# Patient Record
Sex: Male | Born: 1970
Health system: Southern US, Community
[De-identification: ages and names within clinical notes are randomized; demographics above are authoritative.]

## PROBLEM LIST (undated history)

## (undated) DIAGNOSIS — Z9889 Other specified postprocedural states: Secondary | ICD-10-CM

## (undated) DIAGNOSIS — R0989 Other specified symptoms and signs involving the circulatory and respiratory systems: Secondary | ICD-10-CM

## (undated) DIAGNOSIS — I10 Essential (primary) hypertension: Secondary | ICD-10-CM

## (undated) DIAGNOSIS — I639 Cerebral infarction, unspecified: Secondary | ICD-10-CM

## (undated) HISTORY — DX: Other specified symptoms and signs involving the circulatory and respiratory systems: R09.89

## (undated) HISTORY — DX: Essential (primary) hypertension: I10

## (undated) HISTORY — DX: Other specified postprocedural states: Z98.890

## (undated) HISTORY — PX: REPAIR OF ACUTE ASCENDING THORACIC AORTIC DISSECTION: SHX6323

---

## 2008-09-14 ENCOUNTER — Encounter: Admission: RE | Admit: 2008-09-14 | Discharge: 2008-09-14 | Payer: Self-pay | Admitting: Internal Medicine

## 2009-11-19 DIAGNOSIS — I639 Cerebral infarction, unspecified: Secondary | ICD-10-CM

## 2009-11-19 HISTORY — DX: Cerebral infarction, unspecified: I63.9

## 2010-06-17 ENCOUNTER — Inpatient Hospital Stay (HOSPITAL_COMMUNITY): Admission: EM | Admit: 2010-06-17 | Discharge: 2010-06-18 | Payer: Self-pay | Admitting: Emergency Medicine

## 2011-02-03 LAB — POCT I-STAT, CHEM 8
BUN: 7 mg/dL (ref 6–23)
Chloride: 106 mEq/L (ref 96–112)
Creatinine, Ser: 0.9 mg/dL (ref 0.4–1.5)
Glucose, Bld: 115 mg/dL — ABNORMAL HIGH (ref 70–99)
Potassium: 3.9 mEq/L (ref 3.5–5.1)
Sodium: 141 mEq/L (ref 135–145)
TCO2: 25 mmol/L (ref 0–100)

## 2011-02-03 LAB — COMPREHENSIVE METABOLIC PANEL
ALT: 21 U/L (ref 0–53)
ALT: 21 U/L (ref 0–53)
ALT: 22 U/L (ref 0–53)
AST: 25 U/L (ref 0–37)
AST: 27 U/L (ref 0–37)
Albumin: 3.9 g/dL (ref 3.5–5.2)
Alkaline Phosphatase: 87 U/L (ref 39–117)
Alkaline Phosphatase: 91 U/L (ref 39–117)
BUN: 7 mg/dL (ref 6–23)
CO2: 25 mEq/L (ref 19–32)
Calcium: 9 mg/dL (ref 8.4–10.5)
Calcium: 9 mg/dL (ref 8.4–10.5)
Calcium: 9.3 mg/dL (ref 8.4–10.5)
Chloride: 106 mEq/L (ref 96–112)
Creatinine, Ser: 0.93 mg/dL (ref 0.4–1.5)
Creatinine, Ser: 0.93 mg/dL (ref 0.4–1.5)
GFR calc Af Amer: >60 mL/min (ref >60)
GFR calc non Af Amer: >60 mL/min (ref >60)
GFR calc non Af Amer: >60 mL/min (ref >60)
Glucose, Bld: 110 mg/dL — ABNORMAL HIGH (ref 70–99)
Glucose, Bld: 125 mg/dL — ABNORMAL HIGH (ref 70–99)
Sodium: 139 mEq/L (ref 135–145)
Sodium: 140 mEq/L (ref 135–145)
Total Protein: 7.5 g/dL (ref 6.0–8.3)

## 2011-02-03 LAB — CBC
HCT: 43.5 % (ref 39.0–52.0)
HCT: 43.6 % (ref 39.0–52.0)
Hemoglobin: 14.5 g/dL (ref 13.0–17.0)
MCH: 29.8 pg (ref 26.0–34.0)
MCHC: 33 g/dL (ref 30.0–36.0)
MCHC: 33.6 g/dL (ref 30.0–36.0)
MCV: 89.5 fL (ref 78.0–100.0)
MCV: 89.8 fL (ref 78.0–100.0)
Platelets: 212 10*3/uL (ref 150–400)
RDW: 13.2 % (ref 11.5–15.5)
RDW: 13.6 % (ref 11.5–15.5)
WBC: 8.9 10*3/uL (ref 4.0–10.5)

## 2011-02-03 LAB — URINALYSIS, ROUTINE W REFLEX MICROSCOPIC
Bilirubin Urine: NEGATIVE
Glucose, UA: NEGATIVE mg/dL
Protein, ur: NEGATIVE mg/dL
Urobilinogen, UA: 4 mg/dL — ABNORMAL HIGH (ref 0.0–1.0)

## 2011-02-03 LAB — LIPID PANEL
Cholesterol: 166 mg/dL (ref 0–200)
HDL: 39 mg/dL — ABNORMAL LOW (ref >39)
LDL Cholesterol: 117 mg/dL — ABNORMAL HIGH (ref 0–99)
Total CHOL/HDL Ratio: 4.3 RATIO
Triglycerides: 49 mg/dL (ref <150)

## 2011-02-03 LAB — CK TOTAL AND CKMB (NOT AT ARMC)
CK, MB: 1.2 ng/mL (ref 0.3–4.0)
Total CK: 107 U/L (ref 7–232)

## 2011-02-03 LAB — HEMOGLOBIN A1C: Mean Plasma Glucose: 117 mg/dL — ABNORMAL HIGH (ref <117)

## 2011-02-03 LAB — PROTIME-INR: INR: 1.05 (ref 0.00–1.49)

## 2011-02-03 LAB — DIFFERENTIAL
Basophils Relative: 0 % (ref 0–1)
Eosinophils Absolute: 0 10*3/uL (ref 0.0–0.7)
Lymphocytes Relative: 20 % (ref 12–46)
Lymphs Abs: 1.1 10*3/uL (ref 0.7–4.0)
Monocytes Relative: 11 % (ref 3–12)
Neutro Abs: 5.8 10*3/uL (ref 1.7–7.7)
Neutrophils Relative %: 81 % — ABNORMAL HIGH (ref 43–77)

## 2011-02-03 LAB — RAPID URINE DRUG SCREEN, HOSP PERFORMED
Barbiturates: NOT DETECTED
Opiates: NOT DETECTED
Tetrahydrocannabinol: NOT DETECTED

## 2011-02-03 LAB — APTT: aPTT: 27 seconds (ref 24–37)

## 2011-02-03 LAB — URINE MICROSCOPIC-ADD ON

## 2011-02-03 LAB — SEDIMENTATION RATE: Sed Rate: 15 mm/hr (ref 0–16)

## 2011-02-03 LAB — CARDIAC PANEL(CRET KIN+CKTOT+MB+TROPI)
CK, MB: 1.1 ng/mL (ref 0.3–4.0)
Total CK: 99 U/L (ref 7–232)

## 2015-05-30 ENCOUNTER — Ambulatory Visit (HOSPITAL_COMMUNITY)
Admission: RE | Admit: 2015-05-30 | Discharge: 2015-05-30 | Disposition: A | Discharge status: Home / Self Care | Payer: BLUE CROSS/BLUE SHIELD | Source: Ambulatory Visit | Attending: Cardiology | Admitting: Cardiology

## 2015-05-30 ENCOUNTER — Other Ambulatory Visit (HOSPITAL_COMMUNITY): Payer: Self-pay | Admitting: Physician Assistant

## 2015-05-30 DIAGNOSIS — M7989 Other specified soft tissue disorders: Secondary | ICD-10-CM | POA: Insufficient documentation

## 2015-05-30 DIAGNOSIS — I82402 Acute embolism and thrombosis of unspecified deep veins of left lower extremity: Secondary | ICD-10-CM | POA: Diagnosis not present

## 2015-05-30 NOTE — Progress Notes (Signed)
Left lower extremity venous duplex completed.  Left:  No evidence of DVT, superficial thrombosis, or Baker's cyst.  Right:  Negative for DVT in the common femoral vein.  

## 2017-12-23 DIAGNOSIS — R030 Elevated blood-pressure reading, without diagnosis of hypertension: Secondary | ICD-10-CM | POA: Diagnosis not present

## 2018-01-17 DEATH — deceased

## 2018-03-26 DIAGNOSIS — Z23 Encounter for immunization: Secondary | ICD-10-CM | POA: Diagnosis not present

## 2018-03-26 DIAGNOSIS — Z0000 Encounter for general adult medical examination without abnormal findings: Secondary | ICD-10-CM | POA: Diagnosis not present

## 2018-03-26 DIAGNOSIS — R030 Elevated blood-pressure reading, without diagnosis of hypertension: Secondary | ICD-10-CM | POA: Diagnosis not present

## 2018-03-26 DIAGNOSIS — Z6833 Body mass index (BMI) 33.0-33.9, adult: Secondary | ICD-10-CM | POA: Diagnosis not present

## 2018-03-26 DIAGNOSIS — Z13220 Encounter for screening for lipoid disorders: Secondary | ICD-10-CM | POA: Diagnosis not present

## 2018-09-02 ENCOUNTER — Emergency Department (HOSPITAL_COMMUNITY): Payer: BLUE CROSS/BLUE SHIELD

## 2018-09-02 ENCOUNTER — Emergency Department (HOSPITAL_COMMUNITY)
Admission: EM | Admit: 2018-09-02 | Discharge: 2018-09-02 | Disposition: A | Discharge status: Other Acute Inpatient Hospital | Payer: BLUE CROSS/BLUE SHIELD | Attending: Emergency Medicine | Admitting: Emergency Medicine

## 2018-09-02 ENCOUNTER — Other Ambulatory Visit: Payer: Self-pay

## 2018-09-02 ENCOUNTER — Encounter (HOSPITAL_COMMUNITY): Payer: Self-pay | Admitting: Emergency Medicine

## 2018-09-02 DIAGNOSIS — R61 Generalized hyperhidrosis: Secondary | ICD-10-CM | POA: Diagnosis not present

## 2018-09-02 DIAGNOSIS — R1310 Dysphagia, unspecified: Secondary | ICD-10-CM | POA: Diagnosis not present

## 2018-09-02 DIAGNOSIS — D696 Thrombocytopenia, unspecified: Secondary | ICD-10-CM | POA: Diagnosis not present

## 2018-09-02 DIAGNOSIS — Z9981 Dependence on supplemental oxygen: Secondary | ICD-10-CM | POA: Diagnosis not present

## 2018-09-02 DIAGNOSIS — Z48812 Encounter for surgical aftercare following surgery on the circulatory system: Secondary | ICD-10-CM | POA: Diagnosis not present

## 2018-09-02 DIAGNOSIS — I714 Abdominal aortic aneurysm, without rupture: Secondary | ICD-10-CM | POA: Diagnosis not present

## 2018-09-02 DIAGNOSIS — Y69 Unspecified misadventure during surgical and medical care: Secondary | ICD-10-CM | POA: Diagnosis not present

## 2018-09-02 DIAGNOSIS — J9811 Atelectasis: Secondary | ICD-10-CM | POA: Diagnosis not present

## 2018-09-02 DIAGNOSIS — M545 Low back pain: Secondary | ICD-10-CM | POA: Diagnosis not present

## 2018-09-02 DIAGNOSIS — R030 Elevated blood-pressure reading, without diagnosis of hypertension: Secondary | ICD-10-CM | POA: Diagnosis not present

## 2018-09-02 DIAGNOSIS — I7101 Dissection of thoracic aorta: Secondary | ICD-10-CM | POA: Diagnosis not present

## 2018-09-02 DIAGNOSIS — Z79899 Other long term (current) drug therapy: Secondary | ICD-10-CM | POA: Diagnosis not present

## 2018-09-02 DIAGNOSIS — R918 Other nonspecific abnormal finding of lung field: Secondary | ICD-10-CM | POA: Diagnosis not present

## 2018-09-02 DIAGNOSIS — I161 Hypertensive emergency: Secondary | ICD-10-CM | POA: Diagnosis not present

## 2018-09-02 DIAGNOSIS — J9691 Respiratory failure, unspecified with hypoxia: Secondary | ICD-10-CM | POA: Diagnosis not present

## 2018-09-02 DIAGNOSIS — J90 Pleural effusion, not elsewhere classified: Secondary | ICD-10-CM | POA: Diagnosis not present

## 2018-09-02 DIAGNOSIS — J95821 Acute postprocedural respiratory failure: Secondary | ICD-10-CM | POA: Diagnosis not present

## 2018-09-02 DIAGNOSIS — I712 Thoracic aortic aneurysm, without rupture: Secondary | ICD-10-CM | POA: Diagnosis not present

## 2018-09-02 DIAGNOSIS — R0602 Shortness of breath: Secondary | ICD-10-CM | POA: Diagnosis not present

## 2018-09-02 DIAGNOSIS — I351 Nonrheumatic aortic (valve) insufficiency: Secondary | ICD-10-CM | POA: Diagnosis not present

## 2018-09-02 DIAGNOSIS — Z7982 Long term (current) use of aspirin: Secondary | ICD-10-CM | POA: Diagnosis not present

## 2018-09-02 DIAGNOSIS — I517 Cardiomegaly: Secondary | ICD-10-CM | POA: Diagnosis not present

## 2018-09-02 DIAGNOSIS — I7103 Dissection of thoracoabdominal aorta: Secondary | ICD-10-CM

## 2018-09-02 DIAGNOSIS — R9431 Abnormal electrocardiogram [ECG] [EKG]: Secondary | ICD-10-CM | POA: Diagnosis not present

## 2018-09-02 DIAGNOSIS — I10 Essential (primary) hypertension: Secondary | ICD-10-CM | POA: Diagnosis not present

## 2018-09-02 DIAGNOSIS — T8130XA Disruption of wound, unspecified, initial encounter: Secondary | ICD-10-CM | POA: Diagnosis not present

## 2018-09-02 DIAGNOSIS — E876 Hypokalemia: Secondary | ICD-10-CM | POA: Diagnosis not present

## 2018-09-02 HISTORY — DX: Cerebral infarction, unspecified: I63.9

## 2018-09-02 LAB — CBC WITH DIFFERENTIAL/PLATELET
Abs Immature Granulocytes: 0.14 10*3/uL — ABNORMAL HIGH (ref 0.00–0.07)
BASOS PCT: 0 %
Basophils Absolute: 0 10*3/uL (ref 0.0–0.1)
EOS ABS: 0 10*3/uL (ref 0.0–0.5)
Eosinophils Relative: 0 %
HCT: 46.8 % (ref 39.0–52.0)
Hemoglobin: 14.4 g/dL (ref 13.0–17.0)
Immature Granulocytes: 1 %
Lymphocytes Relative: 6 %
Lymphs Abs: 1.3 10*3/uL (ref 0.7–4.0)
MCH: 27.9 pg (ref 26.0–34.0)
MCHC: 30.8 g/dL (ref 30.0–36.0)
MCV: 90.7 fL (ref 80.0–100.0)
MONOS PCT: 8 %
Monocytes Absolute: 1.9 10*3/uL — ABNORMAL HIGH (ref 0.1–1.0)
NEUTROS PCT: 85 %
Neutro Abs: 19.7 10*3/uL — ABNORMAL HIGH (ref 1.7–7.7)
PLATELETS: 275 10*3/uL (ref 150–400)
RBC: 5.16 MIL/uL (ref 4.22–5.81)
RDW: 13.2 % (ref 11.5–15.5)
WBC: 23 10*3/uL — AB (ref 4.0–10.5)
nRBC: 0 % (ref 0.0–0.2)

## 2018-09-02 LAB — COMPREHENSIVE METABOLIC PANEL
ALT: 26 U/L (ref 0–44)
ANION GAP: 13 (ref 5–15)
AST: 33 U/L (ref 15–41)
Albumin: 3.8 g/dL (ref 3.5–5.0)
Alkaline Phosphatase: 75 U/L (ref 38–126)
BUN: 18 mg/dL (ref 6–20)
CHLORIDE: 103 mmol/L (ref 98–111)
CO2: 22 mmol/L (ref 22–32)
Calcium: 9.8 mg/dL (ref 8.9–10.3)
Creatinine, Ser: 1.36 mg/dL — ABNORMAL HIGH (ref 0.61–1.24)
GFR, EST NON AFRICAN AMERICAN: 60 mL/min — AB (ref >60)
Glucose, Bld: 150 mg/dL — ABNORMAL HIGH (ref 70–99)
Potassium: 5 mmol/L (ref 3.5–5.1)
Sodium: 138 mmol/L (ref 135–145)
Total Bilirubin: 0.8 mg/dL (ref 0.3–1.2)
Total Protein: 7.3 g/dL (ref 6.5–8.1)

## 2018-09-02 LAB — I-STAT TROPONIN, ED: TROPONIN I, POC: 0.03 ng/mL (ref 0.00–0.08)

## 2018-09-02 MED ORDER — IOPAMIDOL (ISOVUE-370) INJECTION 76%
100.0000 mL | Freq: Once | INTRAVENOUS | Status: AC | PRN
  Administered 2018-09-02: 100 mL via INTRAVENOUS

## 2018-09-02 MED ORDER — IOPAMIDOL (ISOVUE-370) INJECTION 76%
INTRAVENOUS | Status: AC
  Filled 2018-09-02: qty 100

## 2018-09-02 MED ORDER — IOHEXOL 300 MG/ML  SOLN: 100.0000 mL | Freq: Once | INTRAMUSCULAR | Status: DC | PRN

## 2018-09-02 MED ORDER — HYDROMORPHONE HCL 1 MG/ML IJ SOLN
0.5000 mg | Freq: Once | INTRAMUSCULAR | Status: AC
  Administered 2018-09-02: 0.5 mg via INTRAVENOUS
  Filled 2018-09-02: qty 1

## 2018-09-02 MED ORDER — ESMOLOL HCL-SODIUM CHLORIDE 2000 MG/100ML IV SOLN
25.0000 ug/kg/min | INTRAVENOUS | Status: DC
  Administered 2018-09-02: 25 ug/kg/min via INTRAVENOUS
  Filled 2018-09-02: qty 100

## 2018-09-02 NOTE — ED Notes (Signed)
Patient transported to CT 

## 2018-09-02 NOTE — ED Provider Notes (Signed)
I saw and evaluated the patient, reviewed the resident's note and I agree with the findings and plan with the following exceptions.   Seen earlier today was hypertensive, diaphoretic with left-sided back pain.  Otherwise appears well.  Concern for possible dissection.  My review of the CT scan it does appear the patient has aortic dissection.   Discussed with Dr. Barry Dienes around 1950 face to face. He is taking a more critically ill aortic dissection patient to OR at the present time and expects a prolonged surgery and thus is not available to perform this one. No other CT surgeons on call, will attempt transfer.   Discussed with Dr. Ty Hilts at [facility] and accepts the patient to the emergency room while the OR team is being called in.  Esmolol started with a goal of heart rate less than 60 and blood pressure 100-120.  Pain is nonexistent this point after the second dose of Dilaudid.   [facility] to transfer to [facility].   EMERGENCY DEPARTMENT  US GUIDANCE EXAM Emergency Ultrasound:  US Guidance for Needle Guidance  INDICATIONS: Difficult vascular access Linear probe used in real-time to visualize location of needle entry through skin.   PERFORMED BY: Myself IMAGES ARCHIVED?: No LIMITATIONS: Pain VIEWS USED: Transverse INTERPRETATION: Needle visualized within vein, Left arm and Needle gauge 18  CRITICAL CARE Performed by: Marily Memos Total critical care time: 35 minutes Critical care time was exclusive of separately billable procedures and treating other patients. Critical care was necessary to treat or prevent imminent or life-threatening deterioration. Critical care was time spent personally by me on the following activities: development of treatment plan with patient and/or surrogate as well as nursing, discussions with consultants, evaluation of patient's response to treatment, examination of patient, obtaining history from patient or surrogate, ordering and performing treatments  and interventions, ordering and review of laboratory studies, ordering and review of radiographic studies, pulse oximetry and re-evaluation of patient's condition.    EKG Interpretation  Date/Time:  Tuesday September 02 2018 16:11:56 EDT Ventricular Rate:  86 PR Interval:  158 QRS Duration: 88 QT Interval:  368 QTC Calculation: 440 R Axis:   -7 Text Interpretation:  Normal sinus rhythm Minimal voltage criteria for LVH, may be normal variant Possible Anterior infarct , age undetermined Abnormal ECG No significant change since last tracing Confirmed by Marily Memos [phone]) on 09/02/2018 6:50:55 PM         Talal Fritchman, Barbara Cower, MD 09/02/18 2224

## 2018-09-02 NOTE — ED Notes (Signed)
Pt consented verbally to this RN at [facility] for transport to [facility].

## 2018-09-02 NOTE — ED Triage Notes (Signed)
Patient sent to ED by PCP via POV for further evaluation of BP readings (170/40) and patient presentation - PCP at [facility] reported that patient was sweating profusely in the office and he was pale and diaphoretic. He went to the doctor c/o severe lower back pain onset this morning upon waking - nothing makes it worse or better, it does not radiate, nor is it associated with numbness or tingling. He reports the pain has since improved, but is still there. Denies injury, thinks he may have just slept on it wrong. No significant medical history.

## 2018-09-02 NOTE — ED Notes (Signed)
Report given to [facility] EMT with [facility]

## 2018-09-02 NOTE — ED Notes (Signed)
0.5 mg of Dilaudid wasted with Rhunette Croft, Charity fundraiser,

## 2018-09-02 NOTE — ED Notes (Signed)
Pt back from CT

## 2018-09-02 NOTE — ED Provider Notes (Signed)
[facility] EMERGENCY DEPARTMENT Provider Note   CSN: [id number] Arrival date & time: 09/02/18  1606     History   Chief Complaint Chief Complaint  Patient presents with  . Palpitations  . Back Pain    HPI Alexis Terry is a 47 y.o. male with no diagnosed PMH who presents for evaluation of back pain. He woke up this morning with left lower back pain, followed by diaphoresis and hearing his heart pounding in his ears. His wife scheduled a same day appointment with his PCP and he was noted to be pale, diaphoretic, and hypertensive at his PCP's office. He was sent to the ED for further evaluation. Since being in the ED, he has experienced intermittent diaphoresis and persistent low back pain rated at a 5-7 out of 10 and described as achy. He has taken aspirin and Aleve for the back pain without much relief.   Does not take any blood pressure medication, but has been told her blood pressure was borderline high.   Denies chest pain, sob, nausea, syncope, indigestion.   Never smoker, does not drink alcohol, denies illicit drug use.   HPI  Past Medical History:  Diagnosis Date  . Stroke [facility]) 2011   patient's wife reports "small stroke involving his eye" about 8 years ago, no deficits noted    There are no active problems to display for this patient.   History reviewed. No pertinent surgical history.      Home Medications    Prior to Admission medications   Medication Sig Start Date End Date Taking? Authorizing Provider  acetaminophen (TYLENOL) 500 MG tablet Take 500 mg by mouth every 6 (six) hours as needed for mild pain.    Yes [provider]  aspirin EC 81 MG tablet Take 81 mg by mouth daily.   Yes [provider]  ibuprofen (ADVIL,MOTRIN) 200 MG tablet Take 4,000 mg by mouth every 6 (six) hours as needed for moderate pain.    Yes [provider]    Family History No family history on file.  Social History Social  History   Tobacco Use  . Smoking status: Never Smoker  . Smokeless tobacco: Never Used  Substance Use Topics  . Alcohol use: Not Currently  . Drug use: Never     Allergies   Patient has no known allergies.   Review of Systems Review of Systems See HPI  Physical Exam Updated Vital Signs BP (!) 158/59   Pulse 87   Temp 98.2 F (36.8 C)   Resp 20   Ht 6\' 1"  (1.854 m)   Wt 103.9 kg   SpO2 96%   BMI 30.21 kg/m   Physical Exam Vitals:   09/02/18 1945 09/02/18 2015 09/02/18 2020 09/02/18 2034  BP: (!) 151/53 (!) 160/51 (!) 152/59 (!) 158/59  Pulse: 93 79 89 87  Resp: (!) 26 (!) 24 (!) 22 20  Temp:    98.2 F (36.8 C)  TempSrc:      SpO2: 96% 93% 95% 96%  Weight:      Height:       General: Vital signs reviewed. Hypertensive. Patient is well-developed and well-nourished, in no acute distress and cooperative with exam.  Head: Normocephalic and atraumatic. Neck: Supple, trachea midline, normal ROM, no JVD, masses, thyromegaly, or carotid bruit present.  Cardiovascular: RRR, S1 normal, S2 normal, no murmurs, gallops, or rubs. Radial pulses strong and symmetric. Systolic BP has 14 point difference between upper extremities.  Pulmonary/Chest: Clear to auscultation bilaterally, no wheezes, rales, or rhonchi. Abdominal: Soft, non-tender, non-distended, BS +, no masses, organomegaly, or guarding present.  Musculoskeletal: Endorses low back pain but he has no tenderness to palpation of the midline spine or paraspinal regions bilaterally. No everything redness, swelling, or warmth.  Extremities: No lower extremity edema bilaterally,  pulses symmetric and intact bilaterally. No cyanosis or clubbing. Neurological: A&O x3, lower extremity strength is normal and symmetric bilaterally, cranial nerve II-XII are grossly intact, no focal motor deficit, sensory intact to light touch bilaterally.  Skin: Warm, dry and intact. No rashes or erythema. Psychiatric: anxious mood and affect.  speech and behavior is normal. Cognition and memory are normal.    ED Treatments / Results  Labs (all labs ordered are listed, but only abnormal results are displayed) Labs Reviewed  CBC WITH DIFFERENTIAL/PLATELET - Abnormal; Notable for the following components:      Result Value   WBC 23.0 (*)    Neutro Abs 19.7 (*)    Monocytes Absolute 1.9 (*)    Abs Immature Granulocytes 0.14 (*)    All other components within normal limits  COMPREHENSIVE METABOLIC PANEL - Abnormal; Notable for the following components:   Glucose, Bld 150 (*)    Creatinine, Ser 1.36 (*)    GFR calc non Af Amer 60 (*)    All other components within normal limits  URINALYSIS, ROUTINE W REFLEX MICROSCOPIC  I-STAT TROPONIN, ED    EKG EKG Interpretation  Date/Time:  Tuesday September 02 2018 16:11:56 EDT Ventricular Rate:  86 PR Interval:  158 QRS Duration: 88 QT Interval:  368 QTC Calculation: 440 R Axis:   -7 Text Interpretation:  Normal sinus rhythm Minimal voltage criteria for LVH, may be normal variant Possible Anterior infarct , age undetermined Abnormal ECG No significant change since last tracing Confirmed by Marily Memos [phone]) on 09/02/2018 6:50:55 PM   Radiology Dg Chest 2 View  Result Date: 09/02/2018 CLINICAL DATA:  Shortness of breath and dizziness. EXAM: CHEST - 2 VIEW COMPARISON:  Chest x-ray dated June 17, 2010. FINDINGS: The heart is at the upper limits of normal in size. Normal pulmonary vascularity. No focal consolidation, pleural effusion, or pneumothorax. No acute osseous abnormality. IMPRESSION: No active cardiopulmonary disease. Electronically Signed   By: Obie Dredge M.D.   On: 09/02/2018 17:48   Ct Angio Chest/abd/pel For Dissection W And/or W/wo  Result Date: 09/02/2018 CLINICAL DATA:  Back pain with diaphoresis starting this morning. EXAM: CT ANGIOGRAPHY CHEST, ABDOMEN AND PELVIS TECHNIQUE: Multidetector CT imaging through the chest, abdomen and pelvis was performed using  the standard protocol during bolus administration of intravenous contrast. Multiplanar reconstructed images and MIPs were obtained and reviewed to evaluate the vascular anatomy. CONTRAST:  ISOVUE-370 IOPAMIDOL (ISOVUE-370) INJECTION 76% COMPARISON:  , Same day CXR FINDINGS: CTA CHEST FINDINGS Cardiovascular: Aneurysmal dilatation of the aortic root to 5.1 cm. Acute Stanford type A thoracic aortic dissection is noted with extension of dissection into the right brachiocephalic artery and left subclavian artery. The dissection flap spirals obliquely into the abdominal aorta. No acute pulmonary embolus. Mediastinum/Nodes: No enlarged mediastinal, hilar, or axillary lymph nodes. Thyroid gland, trachea, and esophagus demonstrate no significant findings. Lungs/Pleura: Bibasilar dependent atelectasis. No pulmonary consolidation, effusion or pneumothorax. Musculoskeletal: No chest wall abnormality. No acute or significant osseous findings. Review of the MIP images confirms the above findings. CTA ABDOMEN AND PELVIS FINDINGS VASCULAR Aorta: The thoracic aortic dissection extends into the abdominal aorta. No aneurysm identified of  the abdominal aorta. Celiac: Patent and fed from the anterior lumen of the dissection. No occlusion or extension of dissection. No aneurysmal dilatation. SMA: Patent infant bed anterior lumen of the dissection. No occlusion or extension dissection. No aneurysmal dilatation. Renals: Fifth by the posterior lumen on right and likewise on the left. There appears to be faint linear dissection flap extending into the left renal artery which may be contributing to the renal infarcts involving the upper, medial and lower pole. IMA: Opacified and appears to project off the posterior lumen that is swirling obliquely from posterior to anterior at this level. Inflow: Extension of dissection into both common iliac arteries without aneurysmal dilatation. Extension of dissection into both external iliac  arteries and right internal iliac artery with occlusion of the distal left internal iliac artery. The left internal iliac artery appears patent. Veins: Negative Review of the MIP images confirms the above findings. NON-VASCULAR Hepatobiliary: No focal liver abnormality is seen. No gallstones, gallbladder wall thickening, or biliary dilatation. Pancreas: Unremarkable. No pancreatic ductal dilatation or surrounding inflammatory changes. Spleen: Normal in size without focal abnormality. Adrenals/Urinary Tract: Normal bilateral adrenal glands. Renal infarcts on left as described. Cyst in the lower pole the right kidney measuring 14 mm in diameter. No hydroureteronephrosis. The urinary bladder is unremarkable. Stomach/Bowel: No evidence of pneumatosis, mural thickening or edema. No obstruction. Lymphatic: Negative Reproductive: Prostate is unremarkable. Other: No abdominal wall hernia or abnormality. No abdominopelvic ascites. Musculoskeletal: No acute or significant osseous findings. Review of the MIP images confirms the above findings. IMPRESSION: Acute Stanford type A thoracic aortic dissection starting from the aortic root which is aneurysmal in appearance up to 5.1 cm in caliber. Spiraling of the dissection flap is noted with involvement of dissection into the right brachiocephalic and left subclavian arteries. Extension of dissection into the nonaneurysmal abdominal aorta, both common iliac and external iliac arteries as well as extension into the right internal iliac artery with occluded or near occluded appearance of the distal right common iliac artery and extension of the dissection into the left renal artery. 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 Critical Value/emergent results were called by telephone at the time of interpretation on 09/02/2018 at 8:13 pm to Dr. Maryelizabeth Kaufmann, who verbally acknowledged these results. Electronically Signed   By: Tollie Eth M.D.   On: 09/02/2018  20:14    Procedures Procedures (including critical care time)  Medications Ordered in ED Medications  iohexol (OMNIPAQUE) 300 MG/ML solution 100 mL (has no administration in time range)  iopamidol (ISOVUE-370) 76 % injection (has no administration in time range)  esmolol (BREVIBLOC) 2000 mg / 100 mL (20 mg/mL) infusion (25 mcg/kg/min  103.9 kg Intravenous Transfusing/Transfer 09/02/18 2051)  HYDROmorphone (DILAUDID) injection 0.5 mg (0.5 mg Intravenous Given 09/02/18 1919)  iopamidol (ISOVUE-370) 76 % injection 100 mL (100 mLs Intravenous Contrast Given 09/02/18 1930)  HYDROmorphone (DILAUDID) injection 0.5 mg (0.5 mg Intravenous Given 09/02/18 2012)     Initial Impression / Assessment and Plan / ED Course  I have reviewed the triage vital signs and the nursing notes.  Pertinent labs & imaging results that were available during my care of the patient were reviewed by me and considered in my medical decision making (see chart for details).  47yo male with no significant PMHx presents with acute onset low back pain when he woke up this morning with associated sob, diaphoresis, and "hearing his heart pound in his ears." He is hypertensive with widened pulse pressure, but  comfortable appearing. Currently he is only experiencing bilateral low back pain. Concern for aortic dissection given hypertension, diaphoresis, and back pain. Doubt MI since no chest pain, troponin is normal and no ischemic changes on EKG. Could be symptomatic hypertension but would like to rule out aortic dissection.   Given 0.5mg  IV dilaudid for back pain.   CT reveals type A aortic dissection with dilated aortic root and dissection extending into the pelvis with left kidney infarction and right internal iliac artery blockage.   Patient remains hemodynamically stable and hypertensive. Will start IV esmolol and titrate to HR < 60 and SBP 100-120. CT surgery is involved with another case so patient will need to be  transferred elsewhere for surgery.   Will transfer to [facility] ED. Dr. Ty Hilts is accepting his care.   Final Clinical Impressions(s) / ED Diagnoses   Final diagnoses:  Dissection of thoracoabdominal aorta [facility])    ED Discharge Orders    None       Ali Lowe, MD 09/02/18 2135    Marily Memos, MD 09/02/18 2219

## 2018-09-02 NOTE — ED Notes (Signed)
This RN called CT to request CD of scan to be sent with pt to [facility], was told by CT that they would not be providing the CD before [facility] arrives due to a code stroke. MD mesner aware.

## 2018-09-02 NOTE — ED Provider Notes (Signed)
Patient placed in Quick Look pathway, seen and evaluated   Chief Complaint: back pain  HPI:   Pt states woke up this morning with lower back pain. Later on had palpitations, diaphoresis. Went to PCP, had elevated BP there, diaphoretic there.  pt sent here for further evaluation.  She denies any chest pain or shortness of breath.  No dizziness or lightheadedness.  Denies any pain in his extremities.  Pain in his back does not radiate.  No abdominal pain.  ROS: Positive for back pain.  Negative for abdominal pain, nausea, vomiting, chest pain, shortness of breath  Physical Exam:   Gen: No distress  Neuro: Awake and Alert  Skin: Warm    Focused Exam: Lungs are clear on auscultation bilaterally.  Regular heart rate and rhythm.  Abdomen is nontender.  No pain with bilateral straight leg raise.  No tenderness over lumbar midline spine or paraspinal muscles.  Patient with atraumatic lower back pain that started this morning, now with palpitations earlier today and diaphoresis.  Currently no diaphoresis or palpitations.  I reviewed patient's note from his primary care doctor which she states that he was diaphoretic in the office.  He had a large gap between systolic and diastolic and they were concerned this might be cardiac in cause.  Will get labs and chest x-ray.  Vitals:   09/02/18 1611 09/02/18 1617 09/02/18 1618 09/02/18 1619  BP: (!) 160/52 (!) 163/54 (!) 157/47   Pulse: 81     Resp: 16     Temp: 98.2 F (36.8 C)     TempSrc: Oral     SpO2: 98%     Weight:    103.9 kg  Height:    6\' 1"  (1.854 m)      Initiation of care has begun. The patient has been counseled on the process, plan, and necessity for staying for the completion/evaluation, and the remainder of the medical screening examination    Jaynie Crumble, PA-C 09/02/18 1635    Little, Ambrose Finland, MD 09/03/18 1420

## 2018-09-03 MED ORDER — ACETAMINOPHEN 500 MG PO TABS
1000.00 | ORAL_TABLET | ORAL | Status: DC
Start: 2018-09-04 — End: 2018-09-03

## 2018-09-03 MED ORDER — SODIUM CHLORIDE 0.9 % IV SOLN
INTRAVENOUS | Status: DC
Start: ? — End: 2018-09-03

## 2018-09-03 MED ORDER — GENERIC EXTERNAL MEDICATION
1.00 | Status: DC
Start: ? — End: 2018-09-03

## 2018-09-03 MED ORDER — DEXTROSE 10 % IV SOLN
125.00 | INTRAVENOUS | Status: DC
Start: ? — End: 2018-09-03

## 2018-09-03 MED ORDER — INSULIN LISPRO 100 UNIT/ML ~~LOC~~ SOLN
2.00 | SUBCUTANEOUS | Status: DC
Start: 2018-09-03 — End: 2018-09-03

## 2018-09-03 MED ORDER — OXYCODONE HCL 5 MG PO TABS
5.00 | ORAL_TABLET | ORAL | Status: DC
Start: ? — End: 2018-09-03

## 2018-09-03 MED ORDER — SENNOSIDES-DOCUSATE SODIUM 8.6-50 MG PO TABS
2.00 | ORAL_TABLET | ORAL | Status: DC
Start: 2018-09-09 — End: 2018-09-03

## 2018-09-03 MED ORDER — SORBITOL 70 % RE SOLN
30.00 | RECTAL | Status: DC
Start: ? — End: 2018-09-03

## 2018-09-03 MED ORDER — HYDRALAZINE HCL 20 MG/ML IJ SOLN
20.00 | INTRAMUSCULAR | Status: DC
Start: ? — End: 2018-09-03

## 2018-09-03 MED ORDER — METOPROLOL TARTRATE 25 MG PO TABS
25.00 | ORAL_TABLET | ORAL | Status: DC
Start: 2018-09-03 — End: 2018-09-03

## 2018-09-03 MED ORDER — ONDANSETRON HCL 4 MG/2ML IJ SOLN
4.00 | INTRAMUSCULAR | Status: DC
Start: ? — End: 2018-09-03

## 2018-09-03 MED ORDER — MUPIROCIN 2 % EX OINT
TOPICAL_OINTMENT | CUTANEOUS | Status: DC
Start: 2018-09-03 — End: 2018-09-03

## 2018-09-03 MED ORDER — FENTANYL CITRATE (PF) 50 MCG/ML IJ SOLN
50.00 | INTRAMUSCULAR | Status: DC
Start: ? — End: 2018-09-03

## 2018-09-03 MED ORDER — HYDRALAZINE HCL 50 MG PO TABS
50.00 | ORAL_TABLET | ORAL | Status: DC
Start: 2018-09-04 — End: 2018-09-03

## 2018-09-03 MED ORDER — POLYETHYLENE GLYCOL 3350 17 G PO PACK
17.00 | PACK | ORAL | Status: DC
Start: ? — End: 2018-09-03

## 2018-09-03 MED ORDER — BISACODYL 10 MG RE SUPP
10.00 | RECTAL | Status: DC
Start: ? — End: 2018-09-03

## 2018-09-03 MED ORDER — CLEVIDIPINE 50 MG/100ML IV EMUL
1.00 | INTRAVENOUS | Status: DC
Start: ? — End: 2018-09-03

## 2018-09-03 MED ORDER — ASPIRIN EC 81 MG PO TBEC
81.00 | DELAYED_RELEASE_TABLET | ORAL | Status: DC
Start: 2018-09-10 — End: 2018-09-03

## 2018-09-09 ENCOUNTER — Emergency Department (HOSPITAL_COMMUNITY)
Admission: EM | Admit: 2018-09-09 | Discharge: 2018-09-09 | Disposition: A | Discharge status: Home / Self Care | Payer: BLUE CROSS/BLUE SHIELD | Attending: Emergency Medicine | Admitting: Emergency Medicine

## 2018-09-09 ENCOUNTER — Other Ambulatory Visit: Payer: Self-pay

## 2018-09-09 ENCOUNTER — Encounter (HOSPITAL_COMMUNITY): Payer: Self-pay | Admitting: Emergency Medicine

## 2018-09-09 DIAGNOSIS — Y69 Unspecified misadventure during surgical and medical care: Secondary | ICD-10-CM | POA: Insufficient documentation

## 2018-09-09 DIAGNOSIS — Z7982 Long term (current) use of aspirin: Secondary | ICD-10-CM | POA: Insufficient documentation

## 2018-09-09 DIAGNOSIS — T8130XA Disruption of wound, unspecified, initial encounter: Secondary | ICD-10-CM | POA: Diagnosis not present

## 2018-09-09 MED ORDER — AMOXICILLIN-POT CLAVULANATE 875-125 MG PO TABS
875.00 | ORAL_TABLET | ORAL | Status: DC
Start: 2018-09-09 — End: 2018-09-09

## 2018-09-09 MED ORDER — METOPROLOL TARTRATE 50 MG PO TABS
100.00 | ORAL_TABLET | ORAL | Status: DC
Start: 2018-09-09 — End: 2018-09-09

## 2018-09-09 MED ORDER — POTASSIUM CHLORIDE CRYS ER 20 MEQ PO TBCR
40.00 | EXTENDED_RELEASE_TABLET | ORAL | Status: DC
Start: 2018-09-09 — End: 2018-09-09

## 2018-09-09 MED ORDER — SORBITOL 70 % RE SOLN
30.00 | RECTAL | Status: DC
Start: ? — End: 2018-09-09

## 2018-09-09 MED ORDER — ONDANSETRON 4 MG PO TBDP
4.00 | ORAL_TABLET | ORAL | Status: DC
Start: ? — End: 2018-09-09

## 2018-09-09 MED ORDER — LOSARTAN POTASSIUM 50 MG PO TABS
100.00 | ORAL_TABLET | ORAL | Status: DC
Start: 2018-09-10 — End: 2018-09-09

## 2018-09-09 MED ORDER — ATORVASTATIN CALCIUM 40 MG PO TABS
40.00 | ORAL_TABLET | ORAL | Status: DC
Start: 2018-09-10 — End: 2018-09-09

## 2018-09-09 MED ORDER — HYDRALAZINE HCL 20 MG/ML IJ SOLN
20.00 | INTRAMUSCULAR | Status: DC
Start: ? — End: 2018-09-09

## 2018-09-09 MED ORDER — HYDRALAZINE HCL 25 MG PO TABS
25.00 | ORAL_TABLET | ORAL | Status: DC
Start: 2018-09-09 — End: 2018-09-09

## 2018-09-09 MED ORDER — DIPHENHYDRAMINE HCL 25 MG PO CAPS
25.00 | ORAL_CAPSULE | ORAL | Status: DC
Start: ? — End: 2018-09-09

## 2018-09-09 MED ORDER — POLYETHYLENE GLYCOL 3350 17 G PO PACK
17.00 | PACK | ORAL | Status: DC
Start: ? — End: 2018-09-09

## 2018-09-09 MED ORDER — GUAIFENESIN ER 600 MG PO TB12
600.00 | ORAL_TABLET | ORAL | Status: DC
Start: 2018-09-09 — End: 2018-09-09

## 2018-09-09 MED ORDER — ACETAMINOPHEN 500 MG PO TABS
1000.00 | ORAL_TABLET | ORAL | Status: DC
Start: ? — End: 2018-09-09

## 2018-09-09 MED ORDER — HEPARIN SODIUM (PORCINE) 5000 UNIT/ML IJ SOLN
5000.00 | INTRAMUSCULAR | Status: DC
Start: 2018-09-09 — End: 2018-09-09

## 2018-09-09 NOTE — ED Triage Notes (Signed)
Pt discharged from [facility] today after having thoracic dissection repair. C/o bleeding from right groin site. Wound open approx 1.5 inches, bleeding controlled at this time. No other complaints.

## 2018-09-09 NOTE — ED Notes (Signed)
Patient verbalizes understanding of discharge instructions. Opportunity for questioning and answers were provided. Armband removed by staff, pt discharged from ED home via POV.  

## 2018-09-09 NOTE — ED Provider Notes (Signed)
[facility] EMERGENCY DEPARTMENT Provider Note   CSN: [id number] Arrival date & time: 09/09/18  2025     History   Chief Complaint Chief Complaint  Patient presents with  . Wound Dehiscence    HPI Alexis Terry is a 47 y.o. male who presents with cc of bleeding from his groin.  Patient is status post thoracic aortic dissection repair on 09/02/2018.  This was done at [facility].  Patient was discharged about an hour ago.  He states that he was at the drug store when he noticed bleeding from his surgical site in his groin.  He came to the emergency department for evaluation.  His bleeding is currently controlled.  He denies any severe pain at this time.   Marland KitchenHPI  Past Medical History:  Diagnosis Date  . Stroke [facility]) 2011   patient's wife reports "small stroke involving his eye" about 8 years ago, no deficits noted    There are no active problems to display for this patient.   Past Surgical History:  Procedure Laterality Date  . REPAIR OF ACUTE ASCENDING THORACIC AORTIC DISSECTION          Home Medications    Prior to Admission medications   Medication Sig Start Date End Date Taking? Authorizing Provider  acetaminophen (TYLENOL) 500 MG tablet Take 500 mg by mouth every 6 (six) hours as needed for mild pain.     [provider]  aspirin EC 81 MG tablet Take 81 mg by mouth daily.    [provider]  ibuprofen (ADVIL,MOTRIN) 200 MG tablet Take 4,000 mg by mouth every 6 (six) hours as needed for moderate pain.     [provider]    Family History No family history on file.  Social History Social History   Tobacco Use  . Smoking status: Never Smoker  . Smokeless tobacco: Never Used  Substance Use Topics  . Alcohol use: Not Currently  . Drug use: Never     Allergies   Patient has no known allergies.   Review of Systems Review of Systems  Ten systems reviewed and are negative for acute change,  except as noted in the HPI.   Physical Exam Updated Vital Signs BP (!) 157/77 (BP Location: Right Arm)   Pulse 97   Temp 99 F (37.2 C) (Oral)   Resp 17   Ht 6\' 1"  (1.854 m)   SpO2 95%   BMI 30.21 kg/m   Physical Exam  Physical Exam  Nursing note and vitals reviewed. Constitutional: He appears well-developed and well-nourished. No distress.  HENT:  Head: Normocephalic and atraumatic.  Eyes: Conjunctivae normal are normal. No scleral icterus.  Neck: Normal range of motion. Neck supple.  Cardiovascular: Normal rate, regular rhythm and normal heart sounds.   Pulmonary/Chest: Effort normal and breath sounds normal. No respiratory distress. Audible well-healing surgical sites on the chest. Abdominal: Soft. There is no tenderness.  There is a 7 cm surgical incision site in the right groin.  It has dehiscence along the medial aspect however is not gaping.  There are visible subcutaneous sutures in the lateral aspect of the wound. Musculoskeletal: He exhibits no edema.  Neurological: He is alert.  Skin: Skin is warm and dry. He is not diaphoretic.  Psychiatric: His behavior is normal.    ED Treatments / Results  Labs (all labs ordered are listed, but only abnormal results are displayed) Labs Reviewed - No data to display  EKG None  Radiology No results found.  Procedures Procedures (including critical care time)  Medications Ordered in ED Medications - No data to display   Initial Impression / Assessment and Plan / ED Course  I have reviewed the triage vital signs and the nursing notes.  Pertinent labs & imaging results that were available during my care of the patient were reviewed by me and considered in my medical decision making (see chart for details).     Shunt with wound dehiscence.  I was able to speak with Dr. Park Breed who is on-call for his CT surgery team.  He advised bandaging and having him follow-up in the clinic.  Is not actively bleeding.  I discussed  home wound care and what to do in the event of active bleeding.  He appears appropriate for discharge at this time.  Final Clinical Impressions(s) / ED Diagnoses   Final diagnoses:  Wound dehiscence    ED Discharge Orders    None       Arthor Captain, PA-C 09/10/18 0136    Cathren Laine, MD 09/10/18 1345

## 2018-09-09 NOTE — ED Notes (Signed)
See providers notes

## 2018-09-09 NOTE — Discharge Instructions (Signed)
Contact a health care provider if: °Your wound does not seem to be healing properly. °You have a fever. °Get help right away if: °You have more redness, swelling, or pain around your wound. °You have more fluid coming from your wound. °Your wound feels warm to the touch. °You have pus or a bad smell coming from your wound. °Your wound breaks open farther. °You have redness streaking or spreading from your wound. °You have excessive bleeding from your wound. °

## 2018-09-16 DIAGNOSIS — D72829 Elevated white blood cell count, unspecified: Secondary | ICD-10-CM | POA: Diagnosis not present

## 2018-09-16 DIAGNOSIS — Z8679 Personal history of other diseases of the circulatory system: Secondary | ICD-10-CM | POA: Diagnosis not present

## 2018-09-16 DIAGNOSIS — I10 Essential (primary) hypertension: Secondary | ICD-10-CM | POA: Insufficient documentation

## 2018-09-23 DIAGNOSIS — Z9889 Other specified postprocedural states: Secondary | ICD-10-CM | POA: Diagnosis not present

## 2018-09-23 DIAGNOSIS — J3489 Other specified disorders of nose and nasal sinuses: Secondary | ICD-10-CM | POA: Diagnosis not present

## 2018-09-23 DIAGNOSIS — J3800 Paralysis of vocal cords and larynx, unspecified: Secondary | ICD-10-CM | POA: Diagnosis not present

## 2018-09-23 DIAGNOSIS — R490 Dysphonia: Secondary | ICD-10-CM | POA: Diagnosis not present

## 2018-09-23 DIAGNOSIS — J383 Other diseases of vocal cords: Secondary | ICD-10-CM | POA: Diagnosis not present

## 2018-09-24 DIAGNOSIS — Z8679 Personal history of other diseases of the circulatory system: Secondary | ICD-10-CM | POA: Diagnosis not present

## 2018-09-24 DIAGNOSIS — Z9889 Other specified postprocedural states: Secondary | ICD-10-CM | POA: Diagnosis not present

## 2018-09-24 DIAGNOSIS — I10 Essential (primary) hypertension: Secondary | ICD-10-CM | POA: Diagnosis not present

## 2018-09-24 DIAGNOSIS — R0989 Other specified symptoms and signs involving the circulatory and respiratory systems: Secondary | ICD-10-CM | POA: Diagnosis not present

## 2018-09-30 ENCOUNTER — Telehealth (HOSPITAL_COMMUNITY): Payer: Self-pay | Admitting: *Deleted

## 2018-09-30 NOTE — Telephone Encounter (Signed)
[facility] in Card[facility] rehab s/p 09/02/18 Thoracic Aorta Dissection Repair by Dr. Ty HiltsKincaid.  Pt with brief ER visit for groin bleeding.  Pt to follow up in the CT clinic on 11/21.  Will have support staff send MD order to Dr. Ty HiltsKincaid, request recent 12 lead ekg.  Once completed and return, will review office follow up note for readiness to proceed with CR, verify insurance benefits/ eligibility and scheduling. Alanson Alyarlette Carlton RN, BSN Cardiac and Emergency planning/management officerulmonary Rehab Nurse Navigator

## 2018-10-02 ENCOUNTER — Telehealth (HOSPITAL_COMMUNITY): Payer: Self-pay

## 2018-10-02 NOTE — Telephone Encounter (Signed)
Attempted to call patient in regards to Cardiac Rehab - LM on VM 

## 2018-10-02 NOTE — Telephone Encounter (Signed)
Pt insurance is active and benefits verified through [place]. Co-pay $0.00, DED $1,300.00/$1,300.00 met, out of pocket $5,200.00/$5,200.00 met, co-insurance 20%. No pre-authorization required. Maria/[employer], 10/02/18 @ 8:31AM, REF# [id number]

## 2018-10-23 DIAGNOSIS — R0989 Other specified symptoms and signs involving the circulatory and respiratory systems: Secondary | ICD-10-CM | POA: Diagnosis not present

## 2018-10-23 DIAGNOSIS — Z9889 Other specified postprocedural states: Secondary | ICD-10-CM | POA: Diagnosis not present

## 2018-10-31 DIAGNOSIS — Z9889 Other specified postprocedural states: Secondary | ICD-10-CM | POA: Diagnosis not present

## 2018-10-31 DIAGNOSIS — R0989 Other specified symptoms and signs involving the circulatory and respiratory systems: Secondary | ICD-10-CM | POA: Diagnosis not present

## 2018-10-31 DIAGNOSIS — I10 Essential (primary) hypertension: Secondary | ICD-10-CM | POA: Diagnosis not present

## 2018-10-31 DIAGNOSIS — Z8679 Personal history of other diseases of the circulatory system: Secondary | ICD-10-CM | POA: Diagnosis not present

## 2018-11-13 DIAGNOSIS — H6692 Otitis media, unspecified, left ear: Secondary | ICD-10-CM | POA: Diagnosis not present

## 2018-11-13 DIAGNOSIS — H9192 Unspecified hearing loss, left ear: Secondary | ICD-10-CM | POA: Diagnosis not present

## 2018-11-13 DIAGNOSIS — H6122 Impacted cerumen, left ear: Secondary | ICD-10-CM | POA: Diagnosis not present

## 2018-11-20 ENCOUNTER — Telehealth (HOSPITAL_COMMUNITY): Payer: Self-pay

## 2018-11-20 NOTE — Telephone Encounter (Signed)
Pt returned [facility] phone stated he is not interested in joining [facility] at this time.   Closed referral.

## 2018-11-20 NOTE — Telephone Encounter (Signed)
Attempted to call patient in regards to Cardiac Rehab - LM on VM 

## 2019-01-14 ENCOUNTER — Ambulatory Visit: Payer: Self-pay | Admitting: Cardiology

## 2019-01-14 ENCOUNTER — Encounter: Payer: Self-pay | Admitting: Cardiology

## 2019-01-14 ENCOUNTER — Other Ambulatory Visit: Payer: Self-pay | Admitting: Cardiology

## 2019-01-14 VITALS — BP 143/75 | HR 78 | Ht 73.0 in | Wt 228.0 lb

## 2019-01-14 DIAGNOSIS — R600 Localized edema: Secondary | ICD-10-CM | POA: Diagnosis not present

## 2019-01-14 DIAGNOSIS — I10 Essential (primary) hypertension: Secondary | ICD-10-CM | POA: Diagnosis not present

## 2019-01-14 DIAGNOSIS — R0989 Other specified symptoms and signs involving the circulatory and respiratory systems: Secondary | ICD-10-CM | POA: Diagnosis not present

## 2019-01-14 DIAGNOSIS — Z8679 Personal history of other diseases of the circulatory system: Secondary | ICD-10-CM

## 2019-01-14 MED ORDER — HYDRALAZINE HCL 50 MG PO TABS: 50.0000 mg | ORAL_TABLET | Freq: Three times a day (TID) | ORAL | 1 refills | Status: DC

## 2019-01-14 NOTE — Progress Notes (Signed)
Subjective:   Alexis Terry, male    DOB: 10-02-1971, 48 y.o.   MRN: [id number]  [name], PA-C:  Chief Complaint  Patient presents with  . Hypertension    HPI: Alexis Terry  is a 48 y.o. male  with hypertension diagnosed in February 2019 that by report was poorly controlled presented with hypertensive emergency and found to have type A aortic dissection on 09/02/2018 and underwent replacement of ascending aorta and hemiarch under [facility] with Dr. Ty Hilts at [facility] on 09/02/2018. Had severe back pain prior to being seen in the ER. He was discharged on 09/09/2018.   Blood pressure was well-controlled after increasing his hydralazine to 50 mg 3 times a day and being started on amlodipine 5 mg daily. He is now back to work and tolerating this well. He has been walking some and tolerates this well.He has not noticed any dizziness, syncope, chest pain, or shortness of breath. He has been monitoring his blood pressure at home frequently and reports generally well controlled. Hoarness has now resolved.  Presented to [facility] in 2011 with left sided eye droop and visual changes. MRI of brain was negative. Diagnosis was unsure. Has not had any reoccurence of symptoms since.  He is here on 3 month follow up for hypertension. He reports leg swelling at the end of the day.   Past Medical History:  Diagnosis Date  . Bilateral carotid bruits   . Hypertension   . S/P aortic dissection repair   . Stroke [facility]) 2011   patient's wife reports "small stroke involving his eye" about 8 years ago, no deficits noted    Past Surgical History:  Procedure Laterality Date  . REPAIR OF ACUTE ASCENDING THORACIC AORTIC DISSECTION      No family history on file.  Social History   Socioeconomic History  . Marital status: Married    Spouse name: Not on file  . Number of children: Not on file  . Years of education: Not on file  . Highest education level: Not on file  Occupational History  .  Not on file  Social Needs  . Financial resource strain: Not on file  . Food insecurity:    Worry: Not on file    Inability: Not on file  . Transportation needs:    Medical: Not on file    Non-medical: Not on file  Tobacco Use  . Smoking status: Never Smoker  . Smokeless tobacco: Never Used  Substance and Sexual Activity  . Alcohol use: Not Currently  . Drug use: Never  . Sexual activity: Not on file  Lifestyle  . Physical activity:    Days per week: Not on file    Minutes per session: Not on file  . Stress: Not on file  Relationships  . Social connections:    Talks on phone: Not on file    Gets together: Not on file    Attends religious service: Not on file    Active member of club or organization: Not on file    Attends meetings of clubs or organizations: Not on file    Relationship status: Not on file  . Intimate partner violence:    Fear of current or ex partner: Not on file    Emotionally abused: Not on file    Physically abused: Not on file    Forced sexual activity: Not on file  Other Topics Concern  . Not on file  Social History Narrative  .  Not on file    Current Meds  Medication Sig  . amLODipine (NORVASC) 10 MG tablet Take 10 mg by mouth daily.  Marland Kitchen aspirin EC 81 MG tablet Take 81 mg by mouth daily.  Marland Kitchen atorvastatin (LIPITOR) 40 MG tablet Take 40 mg by mouth daily.  . hydrALAZINE (APRESOLINE) 25 MG tablet Take 1 tablet by mouth 3 (three) times daily.  Marland Kitchen ibuprofen (ADVIL,MOTRIN) 200 MG tablet Take 4,000 mg by mouth every 6 (six) hours as needed for moderate pain.   Marland Kitchen losartan (COZAAR) 100 MG tablet Take 100 mg by mouth daily.  . Magnesium-Potassium 40-40 MG CAPS Take 1 capsule by mouth daily.  . metoprolol tartrate (LOPRESSOR) 100 MG tablet Take 100 mg by mouth 2 (two) times daily.     Review of Systems  Constitution: Negative for decreased appetite, malaise/fatigue, weight gain and weight loss.  Eyes: Negative for visual disturbance.  Cardiovascular:  Positive for leg swelling. Negative for chest pain, claudication, dyspnea on exertion, orthopnea, palpitations and syncope.  Respiratory: Negative for hemoptysis and wheezing.   Endocrine: Negative for cold intolerance and heat intolerance.  Hematologic/Lymphatic: Does not bruise/bleed easily.  Skin: Negative for nail changes.  Musculoskeletal: Negative for muscle weakness and myalgias.  Gastrointestinal: Negative for abdominal pain, change in bowel habit, nausea and vomiting.  Neurological: Negative for difficulty with concentration, dizziness, focal weakness and headaches.  Psychiatric/Behavioral: Negative for altered mental status and suicidal ideas.  All other systems reviewed and are negative.      Objective:     Blood pressure (!) 143/75, pulse 78, height  (1.854 m), weight 228 lb (103.4 kg), SpO2 97 %.  Echo- 10/23/2018 1. Left ventricle cavity is normal in size. Mild concentric hypertrophy of the left ventricle. Normal global wall motion. Calculated EF 62%. 2. Left atrial cavity is mildly dilated. 3. Mild (Grade I) aortic regurgitation. 4. Mild (Grade I) mitral regurgitation. 5. Mild tricuspid regurgitation. Mild pulmonary hypertension with approx. PA syst. pressure of 33 mm of Hg. 6. The aortic root measures 3.8 cm. Pt. is s/p ascending aorta and hemiarch replacement. 7. IVC is dilated with respiratory variation, suggests elevated RA pressure.  CTA chest/abdomen/pelvis 09/02/2018: IMPRESSION: Acute Stanford type A thoracic aortic dissection starting from the aortic root which is aneurysmal in appearance up to 5.1 cm in caliber. Spiraling of the dissection flap is noted with involvement of dissection into the right brachiocephalic and left subclavian arteries.  Extension of dissection into the nonaneurysmal abdominal aorta, both common iliac and external iliac arteries as well as extension into the right internal iliac artery with occluded or near  occluded appearance of the distal right common iliac artery and extension of the dissection into the left renal artery.  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  Carotid artery duplex 10/23/2018: Stenosis in the right external carotid artery (<50%). This is the source of bruit. Mild soft plaque noted in right carotid artery. Antegrade right vertebral artery flow. Antegrade left vertebral artery flow. Follow up studies is appropriate if clinically indicated.  Physical Exam  Constitutional: He is oriented to person, place, and time. Vital signs are normal. He appears well-developed and well-nourished.  HENT:  Head: Normocephalic and atraumatic.  Neck: Normal range of motion.  Cardiovascular: Normal rate, regular rhythm and intact distal pulses.  Murmur heard.  Early systolic murmur is present with a grade of 1/6 at the upper right sternal border. Pulses:      Carotid pulses are on the right side  with bruit and on the left side with bruit. 1+ pitting  edema  Pulmonary/Chest: Effort normal and breath sounds normal. No accessory muscle usage. No respiratory distress.  Abdominal: Soft. Bowel sounds are normal.  Musculoskeletal: Normal range of motion.  Neurological: He is alert and oriented to person, place, and time.  Skin: Skin is warm and dry.  Vitals reviewed.       Assessment & Recommendations:   1. Essential hypertension Blood pressure continues to be slightly elevated. I will increase his hydralazine up to 50 mg 3 times a day.  Could consider addition of Aldactone.  Discussed avoiding salt in his diet and regular exercise.  2. History of aortic dissection Will need tight blood pressure control.    3. Leg edema I suspect is related to venous insufficiency.  Encouraged him to use support stockings to see if this will help his leg edema.  4. Bilateral carotid bruits Has less than 50% stenosis and right external carotid artery.  On statin and  aspirin therapy.  He will need follow-up carotid duplex if clinically indicated in the future.  Overall, patient is doing well without any symptoms.  I will see him back in 6 weeks to follow-up on his blood pressure.      Altamese Johnsonburg, FNP-C [facility] Office: [phone] Fax: [phone]

## 2019-01-17 DIAGNOSIS — R600 Localized edema: Secondary | ICD-10-CM | POA: Insufficient documentation

## 2019-01-17 MED ORDER — HYDRALAZINE HCL 50 MG PO TABS: 50.0000 mg | ORAL_TABLET | Freq: Three times a day (TID) | ORAL | 1 refills | Status: DC

## 2019-01-19 ENCOUNTER — Other Ambulatory Visit: Payer: Self-pay | Admitting: Cardiology

## 2019-01-19 DIAGNOSIS — I10 Essential (primary) hypertension: Secondary | ICD-10-CM

## 2019-02-25 ENCOUNTER — Ambulatory Visit: Payer: Self-pay | Admitting: Cardiology

## 2019-02-27 DIAGNOSIS — Z1211 Encounter for screening for malignant neoplasm of colon: Secondary | ICD-10-CM | POA: Diagnosis not present

## 2019-03-29 ENCOUNTER — Other Ambulatory Visit: Payer: Self-pay | Admitting: Cardiology

## 2019-03-30 ENCOUNTER — Other Ambulatory Visit: Payer: Self-pay | Admitting: Cardiology

## 2019-03-30 MED ORDER — ATORVASTATIN CALCIUM 40 MG PO TABS: 40.0000 mg | ORAL_TABLET | Freq: Every day | ORAL | 3 refills | Status: DC

## 2019-03-30 NOTE — Telephone Encounter (Signed)
Please fill

## 2019-04-07 ENCOUNTER — Encounter: Payer: Self-pay | Admitting: Cardiology

## 2019-04-08 ENCOUNTER — Encounter: Payer: Self-pay | Admitting: Cardiology

## 2019-04-08 ENCOUNTER — Ambulatory Visit: Payer: BLUE CROSS/BLUE SHIELD | Admitting: Cardiology

## 2019-04-08 ENCOUNTER — Other Ambulatory Visit: Payer: Self-pay

## 2019-04-08 ENCOUNTER — Ambulatory Visit: Payer: Self-pay | Admitting: Cardiology

## 2019-04-08 VITALS — BP 110/55 | HR 67 | Ht 73.0 in | Wt 215.0 lb

## 2019-04-08 DIAGNOSIS — Z8679 Personal history of other diseases of the circulatory system: Secondary | ICD-10-CM | POA: Diagnosis not present

## 2019-04-08 DIAGNOSIS — I10 Essential (primary) hypertension: Secondary | ICD-10-CM

## 2019-04-08 DIAGNOSIS — R600 Localized edema: Secondary | ICD-10-CM | POA: Diagnosis not present

## 2019-04-08 NOTE — Progress Notes (Signed)
Subjective:   Alexis Terry, male    DOB: [birth year], 49 y.o.   MRN: [id number]  [name], PA-C:  Chief Complaint  Patient presents with  . Hypertension  . Follow-up    6wk   This visit type was conducted due to national recommendations for restrictions regarding the COVID-19 Pandemic (e.g. social distancing).  This format is felt to be most appropriate for this patient at this time.  All issues noted in this document were discussed and addressed.  No physical exam was performed (except for noted visual exam findings with Telehealth visits).  The patient has consented to conduct a Telehealth visit and understands insurance will be billed.   I discussed the limitations of evaluation and management by telemedicine and the availability of in person appointments. The patient expressed understanding and agreed to proceed.  Virtual Visit via Video Note is as below  I connected with Alexis Terry, on 04/08/19 at 0915 by telephone and verified that I am speaking with the correct person using two identifiers. Unable to perform video visit despite multiple attempts due to connectivity issues.    I have discussed with her regarding the safety during COVID Pandemic and steps and precautions including social distancing with the patient.    HPI: Alexis Terry  is a 48 y.o. male  with hypertension diagnosed in February 2019 that by report was poorly controlled presented with hypertensive emergency and found to have type A aortic dissection on 09/02/2018 and underwent replacement of ascending aorta and hemiarch under [facility] with Dr. Ty Hilts at [facility] on 09/02/2018. Had severe back pain prior to being seen in the ER. He was discharged on 09/09/2018.   Blood pressure was well-controlled after increasing his hydralazine to 50 mg 3 times a day and being started on amlodipine 5 mg daily. He has not noticed any dizziness, syncope, chest pain, or shortness of breath. He has been monitoring his  blood pressure at home frequently and reports generally well controlled.   Presented to [facility] in 2011 with left sided eye droop and visual changes. MRI of brain was negative. Diagnosis was unsure. Has not had any reoccurence of symptoms since.  He is here on 3 month follow up for hypertension. Occasional leg swelling at the end of the day that is stable. BP at home is generally in the normal range. Tolerating medications well.   He is now unemployed and is mostly staying at home. He has not been exercising regularly.  Past Medical History:  Diagnosis Date  . Bilateral carotid bruits   . Hypertension   . S/P aortic dissection repair   . Stroke [facility]) 2011   patient's wife reports "small stroke involving his eye" about 8 years ago, no deficits noted    Past Surgical History:  Procedure Laterality Date  . REPAIR OF ACUTE ASCENDING THORACIC AORTIC DISSECTION      History reviewed. No pertinent family history.  Social History   Socioeconomic History  . Marital status: Married    Spouse name: Not on file  . Number of children: 1  . Years of education: Not on file  . Highest education level: Not on file  Occupational History  . Not on file  Social Needs  . Financial resource strain: Not on file  . Food insecurity:    Worry: Not on file    Inability: Not on file  . Transportation needs:    Medical: Not on file    Non-medical: Not  on file  Tobacco Use  . Smoking status: Never Smoker  . Smokeless tobacco: Never Used  Substance and Sexual Activity  . Alcohol use: Not Currently  . Drug use: Never  . Sexual activity: Not on file  Lifestyle  . Physical activity:    Days per week: Not on file    Minutes per session: Not on file  . Stress: Not on file  Relationships  . Social connections:    Talks on phone: Not on file    Gets together: Not on file    Attends religious service: Not on file    Active member of club or organization: Not on file    Attends meetings of clubs or  organizations: Not on file    Relationship status: Not on file  . Intimate partner violence:    Fear of current or ex partner: Not on file    Emotionally abused: Not on file    Physically abused: Not on file    Forced sexual activity: Not on file  Other Topics Concern  . Not on file  Social History Narrative  . Not on file    Current Meds  Medication Sig  . losartan (COZAAR) 100 MG tablet Take 100 mg by mouth daily.     Review of Systems  Constitution: Negative for decreased appetite, malaise/fatigue, weight gain and weight loss.  Eyes: Negative for visual disturbance.  Cardiovascular: Positive for leg swelling. Negative for chest pain, claudication, dyspnea on exertion, orthopnea, palpitations and syncope.  Respiratory: Negative for hemoptysis and wheezing.   Endocrine: Negative for cold intolerance and heat intolerance.  Hematologic/Lymphatic: Does not bruise/bleed easily.  Skin: Negative for nail changes.  Musculoskeletal: Negative for muscle weakness and myalgias.  Gastrointestinal: Negative for abdominal pain, change in bowel habit, nausea and vomiting.  Neurological: Negative for difficulty with concentration, dizziness, focal weakness and headaches.  Psychiatric/Behavioral: Negative for altered mental status and suicidal ideas.  All other systems reviewed and are negative.      Objective:     Blood pressure (!) 110/55, pulse 67, height 6\' 1"  (1.854 m), weight 215 lb (97.5 kg).  Echo- 10/23/2018 1. Left ventricle cavity is normal in size. Mild concentric hypertrophy of the left ventricle. Normal global wall motion. Calculated EF 62%. 2. Left atrial cavity is mildly dilated. 3. Mild (Grade I) aortic regurgitation. 4. Mild (Grade I) mitral regurgitation. 5. Mild tricuspid regurgitation. Mild pulmonary hypertension with approx. PA syst. pressure of 33 mm of Hg. 6. The aortic root measures 3.8 cm. Pt. is s/p ascending aorta and hemiarch replacement. 7. IVC is  dilated with respiratory variation, suggests elevated RA pressure.  CTA chest/abdomen/pelvis 09/02/2018: IMPRESSION: Acute Stanford type A thoracic aortic dissection starting from the aortic root which is aneurysmal in appearance up to 5.1 cm in caliber. Spiraling of the dissection flap is noted with involvement of dissection into the right brachiocephalic and left subclavian arteries.  Extension of dissection into the nonaneurysmal abdominal aorta, both common iliac and external iliac arteries as well as extension into the right internal iliac artery with occluded or near occluded appearance of the distal right common iliac artery and extension of the dissection into the left renal artery.  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  Carotid artery duplex 10/23/2018: Stenosis in the right external carotid artery (<50%). This is the source of bruit. Mild soft plaque noted in right carotid artery. Antegrade right vertebral artery flow. Antegrade left vertebral artery flow. Follow up studies is  appropriate if clinically indicated.  Physical exam not performed or limited due to virtual visit.    Please see exam details from prior visit is as below.   Physical Exam  Constitutional: He is oriented to person, place, and time. Vital signs are normal. He appears well-developed and well-nourished.  HENT:  Head: Normocephalic and atraumatic.  Neck: Normal range of motion.  Cardiovascular: Normal rate, regular rhythm and intact distal pulses.  Murmur heard.  Early systolic murmur is present with a grade of 1/6 at the upper right sternal border. Pulses:      Carotid pulses are on the right side with bruit and on the left side with bruit. 1+ pitting  edema  Pulmonary/Chest: Effort normal and breath sounds normal. No accessory muscle usage. No respiratory distress.  Abdominal: Soft. Bowel sounds are normal.  Musculoskeletal: Normal range of motion.  Neurological:  He is alert and oriented to person, place, and time.  Skin: Skin is warm and dry.  Vitals reviewed.       Assessment & Recommendations:   Essential hypertension  History of aortic dissection  Leg edema  Plan: Patient is overall doing well.  No new complaints today.  Leg edema has remained stable.  Suspect possible venous insufficiency.  Overall his blood pressure has been stable with only occasional slight elevations.  We will continue with current medications.  He is without chest pain or shortness of breath.  I have encouraged him to start regular exercise.  Would recommend labs in the next few months including CBC, CMP, and lipid panel.  He does not currently have an appointment scheduled with PCP, encouraged him that if he does not see her in the next few months, to contact me and we will order labs at that time.  As he has remained stable, I will see him back in 6 months or sooner if problems.  Altamese Grand Cane, FNP-C [facility] Office: [phone] Fax: [phone]

## 2019-06-26 ENCOUNTER — Other Ambulatory Visit: Payer: Self-pay

## 2019-06-26 DIAGNOSIS — I10 Essential (primary) hypertension: Secondary | ICD-10-CM

## 2019-06-26 MED ORDER — METOPROLOL TARTRATE 100 MG PO TABS: 100.0000 mg | ORAL_TABLET | Freq: Two times a day (BID) | ORAL | 3 refills | Status: DC

## 2019-06-28 ENCOUNTER — Other Ambulatory Visit: Payer: Self-pay | Admitting: Cardiology

## 2019-09-29 ENCOUNTER — Telehealth: Payer: Self-pay

## 2019-09-29 ENCOUNTER — Ambulatory Visit (INDEPENDENT_AMBULATORY_CARE_PROVIDER_SITE_OTHER): Payer: BC Managed Care – PPO | Admitting: Cardiology

## 2019-09-29 ENCOUNTER — Encounter: Payer: Self-pay | Admitting: Cardiology

## 2019-09-29 ENCOUNTER — Other Ambulatory Visit: Payer: Self-pay

## 2019-09-29 VITALS — BP 129/74 | HR 66 | Temp 97.8°F | Ht 73.0 in | Wt 247.0 lb

## 2019-09-29 DIAGNOSIS — I10 Essential (primary) hypertension: Secondary | ICD-10-CM | POA: Diagnosis not present

## 2019-09-29 DIAGNOSIS — E782 Mixed hyperlipidemia: Secondary | ICD-10-CM | POA: Diagnosis not present

## 2019-09-29 DIAGNOSIS — R600 Localized edema: Secondary | ICD-10-CM

## 2019-09-29 DIAGNOSIS — Z9889 Other specified postprocedural states: Secondary | ICD-10-CM

## 2019-09-29 DIAGNOSIS — Z8679 Personal history of other diseases of the circulatory system: Secondary | ICD-10-CM

## 2019-09-29 DIAGNOSIS — I712 Thoracic aortic aneurysm, without rupture, unspecified: Secondary | ICD-10-CM

## 2019-09-29 MED ORDER — ASPIRIN 81 MG PO TBEC: DELAYED_RELEASE_TABLET | ORAL | 1 refills | Status: DC

## 2019-09-29 NOTE — Telephone Encounter (Signed)
Pt aware.

## 2019-09-29 NOTE — Progress Notes (Signed)
Primary Physician:  Nicholes Rough, PA-C   Patient ID: Alexis Terry, male    DOB: 14-May-1971, 48 y.o.   MRN: [id number]  Subjective:    Chief Complaint  Patient presents with  . Hypertension  . Thoracic Aortic Dissection  . Follow-up    6 month    HPI: Alexis Terry  is a 48 y.o. male  with hypertension diagnosed in February 2019 that by report was poorly controlled presented with hypertensive emergency and found to have type A aortic dissection on 09/02/2018 and underwent replacement of ascending aorta and hemiarch under [facility] with Dr. Lunette Stands at [facility] on 09/02/2018. Had severe back pain prior to being seen in the ER. He was discharged on 09/09/2018.   Blood pressure was well-controlled after increasing his hydralazine to 50 mg 3 times a day and being started on amlodipine 5 mg daily. He has not noticed any dizziness, syncope, chest pain, or shortness of breath. He has been monitoring his blood pressure at home frequently and reports generally well controlled.   Presented to [facility] in 2011 with left sided eye droop and visual changes. MRI of brain was negative. Diagnosis was unsure. Has not had any reoccurence of symptoms since.  He is here on 3 month follow up for hypertension. Occasional leg swelling at the end of the day that is stable. BP at home is generally in the normal range. Tolerating medications well.   He is now retired. He has been exercising with regular walking and skating on the weekends.  Past Medical History:  Diagnosis Date  . Bilateral carotid bruits   . Hypertension   . S/P aortic dissection repair   . Stroke [facility]) 2011   patient's wife reports "small stroke involving his eye" about 8 years ago, no deficits noted    Past Surgical History:  Procedure Laterality Date  . REPAIR OF ACUTE ASCENDING THORACIC AORTIC DISSECTION      Social History   Socioeconomic History  . Marital status: Married    Spouse name: Not on file  . Number of  children: 1  . Years of education: Not on file  . Highest education level: Not on file  Occupational History  . Not on file  Social Needs  . Financial resource strain: Not on file  . Food insecurity    Worry: Not on file    Inability: Not on file  . Transportation needs    Medical: Not on file    Non-medical: Not on file  Tobacco Use  . Smoking status: Never Smoker  . Smokeless tobacco: Never Used  Substance and Sexual Activity  . Alcohol use: Not Currently  . Drug use: Never  . Sexual activity: Not on file  Lifestyle  . Physical activity    Days per week: Not on file    Minutes per session: Not on file  . Stress: Not on file  Relationships  . Social Herbalist on phone: Not on file    Gets together: Not on file    Attends religious service: Not on file    Active member of club or organization: Not on file    Attends meetings of clubs or organizations: Not on file    Relationship status: Not on file  . Intimate partner violence    Fear of current or ex partner: Not on file    Emotionally abused: Not on file    Physically abused: Not on file    Forced  sexual activity: Not on file  Other Topics Concern  . Not on file  Social History Narrative  . Not on file    Review of Systems  Constitution: Negative for decreased appetite, malaise/fatigue, weight gain and weight loss.  Eyes: Negative for visual disturbance.  Cardiovascular: Positive for leg swelling. Negative for chest pain, claudication, dyspnea on exertion, orthopnea, palpitations and syncope.  Respiratory: Negative for hemoptysis and wheezing.   Endocrine: Negative for cold intolerance and heat intolerance.  Hematologic/Lymphatic: Does not bruise/bleed easily.  Skin: Negative for nail changes.  Musculoskeletal: Negative for muscle weakness and myalgias.  Gastrointestinal: Negative for abdominal pain, change in bowel habit, nausea and vomiting.  Neurological: Negative for difficulty with  concentration, dizziness, focal weakness and headaches.  Psychiatric/Behavioral: Negative for altered mental status and suicidal ideas.  All other systems reviewed and are negative.     Objective:  Blood pressure 129/74, pulse 66, temperature 97.8 F (36.6 C), height  (1.854 m), weight 247 lb (112 kg), SpO2 98 %. Body mass index is 32.59 kg/m.    Physical Exam  Constitutional: He is oriented to person, place, and time. Vital signs are normal. He appears well-developed and well-nourished.  HENT:  Head: Normocephalic and atraumatic.  Neck: Normal range of motion.  Cardiovascular: Normal rate, regular rhythm and intact distal pulses.  Murmur heard.  Early systolic murmur is present with a grade of 1/6 at the upper right sternal border. Pulses:      Carotid pulses are on the right side with bruit and on the left side with bruit. 1+ pitting  edema  Pulmonary/Chest: Effort normal and breath sounds normal. No accessory muscle usage. No respiratory distress.  Abdominal: Soft. Bowel sounds are normal.  Musculoskeletal: Normal range of motion.  Neurological: He is alert and oriented to person, place, and time.  Skin: Skin is warm and dry.  Vitals reviewed.  Radiology: No results found.  Laboratory examination:    CMP Latest Ref Rng & Units 09/02/2018 06/17/2010 06/17/2010  Glucose 70 - 99 mg/dL 409(W) 119(J) 478(G)  BUN 6 - 20 mg/dL Creatinine 0.61 - 1.24 mg/dL 9.56(O) 1.30 8.65  Sodium 135 - 145 mmol/L 138 140 140  Potassium 3.5 - 5.1 mmol/L 5.0 4.2 4.6  Chloride 98 - 111 mmol/L 103 106 106  CO2 22 - 32 mmol/L Calcium 8.9 - 10.3 mg/dL 9.8 9.0 9.3  Total Protein 6.5 - 8.1 g/dL 7.3 7.1 7.4  Total Bilirubin 0.3 - 1.2 mg/dL 0.8 0.5 0.4  Alkaline Phos 38 - 126 U/L 75 87 91  AST 15 - 41 U/L 33 24 25  ALT 0 - 44 U/L CBC Latest Ref Rng & Units 09/02/2018 06/17/2010 06/17/2010  WBC 4.0 - 10.5 K/uL 23.0(H) 8.9 8.7  Hemoglobin 13.0 - 17.0 g/dL 78.4 69.6  29.5  Hematocrit 39.0 - 52.0 % 46.8 43.5 43.6  Platelets 150 - 400 K/uL 275 210 212   Lipid Panel     Component Value Date/Time   CHOL  06/17/2010 0545    166        ATP III CLASSIFICATION:  <200     mg/dL   Desirable  284-132  mg/dL   Borderline High  >=440    mg/dL   High          TRIG 49 06/17/2010 0545   HDL 39 (L) 06/17/2010 0545   CHOLHDL 4.3 06/17/2010 0545   VLDL 10  06/17/2010 0545   LDLCALC (H) 06/17/2010 0545    117        Total Cholesterol/HDL:CHD Risk Coronary Heart Disease Risk Table                     Men   Women  1/2 Average Risk   3.4   3.3  Average Risk       5.0   4.4  2 X Average Risk   9.6   7.1  3 X Average Risk  23.4   11.0        Use the calculated Patient Ratio above and the CHD Risk Table to determine the patient's CHD Risk.        ATP III CLASSIFICATION (LDL):  <100     mg/dL   Optimal  696-295  mg/dL   Near or Above                    Optimal  130-159  mg/dL   Borderline  284-132  mg/dL   High  >440     mg/dL   Very High   HEMOGLOBIN A1C Lab Results  Component Value Date   HGBA1C (H) 06/17/2010    5.7 (NOTE)                                                                       According to the ADA Clinical Practice Recommendations for 2011, when HbA1c is used as a screening test:   >=6.5%   Diagnostic of Diabetes Mellitus           (if abnormal result  is confirmed)  5.7-6.4%   Increased risk of developing Diabetes Mellitus  References:Diagnosis and Classification of Diabetes Mellitus,Diabetes Care,2011,34(Suppl 1):S62-S69 and Standards of Medical Care in         Diabetes - 2011,Diabetes Care,2011,34  (Suppl 1):S11-S61.   MPG 117 (H) 06/17/2010   TSH No results for input(s): TSH in the last 8760 hours.  PRN Meds:. Medications Discontinued During This Encounter  Medication Reason  . ASPIRIN LOW DOSE 81 MG EC tablet Reorder   Current Meds  Medication Sig  . amLODipine (NORVASC) 10 MG tablet Take 5 mg by mouth daily.   Marland Kitchen aspirin  (ASPIRIN LOW DOSE) 81 MG EC tablet TAKE 1 TABLET(81 MG) BY MOUTH DAILY  . atorvastatin (LIPITOR) 40 MG tablet Take 1 tablet (40 mg total) by mouth daily.  . hydrALAZINE (APRESOLINE) 50 MG tablet TAKE 1 TABLET(50 MG) BY MOUTH THREE TIMES DAILY  . ibuprofen (ADVIL,MOTRIN) 200 MG tablet Take 4,000 mg by mouth every 6 (six) hours as needed for moderate pain.   Marland Kitchen losartan (COZAAR) 100 MG tablet TAKE 1 TABLET(100 MG) BY MOUTH TWICE DAILY  . metoprolol tartrate (LOPRESSOR) 100 MG tablet Take 1 tablet (100 mg total) by mouth 2 (two) times daily.  . [DISCONTINUED] ASPIRIN LOW DOSE 81 MG EC tablet TAKE 1 TABLET(81 MG) BY MOUTH DAILY    Cardiac Studies:    Echo- 10/23/2018 1. Left ventricle cavity is normal in size. Mild concentric hypertrophy of the left ventricle. Normal global wall motion. Calculated EF 62%. 2. Left atrial cavity is mildly dilated. 3. Mild (Grade I) aortic regurgitation. 4. Mild (Grade I)  mitral regurgitation. 5. Mild tricuspid regurgitation. Mild pulmonary hypertension with approx. PA syst. pressure of 33 mm of Hg. 6. The aortic root measures 3.8 cm. Pt. is s/p ascending aorta and hemiarch replacement. 7. IVC is dilated with respiratory variation, suggests elevated RA pressure.  CTA chest/abdomen/pelvis 09/02/2018: IMPRESSION: Acute Stanford type A thoracic aortic dissection starting from the aortic root which is aneurysmal in appearance up to 5.1 cm in caliber. Spiraling of the dissection flap is noted with involvement of dissection into the right brachiocephalic and left subclavian arteries.  Extension of dissection into the nonaneurysmal abdominal aorta, both common iliac and external iliac arteries as well as extension into the right internal iliac artery with occluded or near occluded appearance of the distal right common iliac artery and extension of the dissection into the left renal artery.  Scattered left renal infarctions from left renal artery  dissection.  Carotid artery duplex 10/23/2018: Stenosis in the right external carotid artery (<50%). This is the source of bruit. Mild soft plaque noted in right carotid artery. Antegrade right vertebral artery flow. Antegrade left vertebral artery flow. Follow up studies is appropriate if clinically indicated.  Assessment:   Essential hypertension - Plan: EKG 12-Lead, CBC, Comprehensive Metabolic Panel (CMET)  Hx of repair of dissecting thoracic aortic aneurysm, Stanford type A  Mixed hyperlipidemia - Plan: Lipid Profile  Leg edema  EKG 09/29/2019: Normal sinus rhythm at 68 bpm, normal axis, PRWP cannot exclude anterior infarct old. No evidence of ischemia.   Recommendations:   Patient is doing well without any significant complaints.  He is complaining of some leg edema that he notices intermittently, not necessarily bothered by this.  I suspect related to amlodipine.  We will decrease his dose of amlodipine down to 5 mg as his blood pressure is well controlled.  He will continue to monitor his blood pressure at home and will make needed changes at that time.  He is without any chest pain, shortness of breath, or back pain.  He is status post 1 year from aortic dissection.  He will need repeat imaging for surveillance of repair.  He has not seen his PCP in some time.  Will obtain CBC, CMP, and lipids for surveillance and management of hyperlipidemia.  I will plan to see him back in 3 months to follow-up on leg edema, but encouraged him to contact me sooner if needed.  Toniann FailAshton Haynes Camile Esters, MSN, APRN, FNP-C [facility] Office: [phone] Fax: [phone]

## 2019-09-29 NOTE — Telephone Encounter (Signed)
Pt called to let you know that he is taking amlodipine 10 mg QD

## 2019-09-29 NOTE — Telephone Encounter (Signed)
Have him cut it down to 5 mg daily and keep a watch on his BP

## 2019-09-30 LAB — COMPREHENSIVE METABOLIC PANEL
ALT: 21 IU/L (ref 0–44)
AST: 23 IU/L (ref 0–40)
Albumin/Globulin Ratio: 1.7 (ref 1.2–2.2)
Albumin: 4.7 g/dL (ref 4.0–5.0)
Alkaline Phosphatase: 125 IU/L — ABNORMAL HIGH (ref 39–117)
BUN/Creatinine Ratio: 17 (ref 9–20)
BUN: 19 mg/dL (ref 6–24)
Bilirubin Total: 0.6 mg/dL (ref 0.0–1.2)
CO2: 22 mmol/L (ref 20–29)
Calcium: 10.2 mg/dL (ref 8.7–10.2)
Chloride: 103 mmol/L (ref 96–106)
Creatinine, Ser: 1.12 mg/dL (ref 0.76–1.27)
GFR calc Af Amer: 88 mL/min/{1.73_m2} (ref >59)
GFR calc non Af Amer: 76 mL/min/{1.73_m2} (ref >59)
Globulin, Total: 2.8 g/dL (ref 1.5–4.5)
Glucose: 109 mg/dL — ABNORMAL HIGH (ref 65–99)
Potassium: 5 mmol/L (ref 3.5–5.2)
Sodium: 139 mmol/L (ref 134–144)
Total Protein: 7.5 g/dL (ref 6.0–8.5)

## 2019-09-30 LAB — CBC
Hematocrit: 42.5 % (ref 37.5–51.0)
Hemoglobin: 14 g/dL (ref 13.0–17.7)
MCH: 28 pg (ref 26.6–33.0)
MCHC: 32.9 g/dL (ref 31.5–35.7)
MCV: 85 fL (ref 79–97)
Platelets: 283 10*3/uL (ref 150–450)
RBC: 5 x10E6/uL (ref 4.14–5.80)
RDW: 12.9 % (ref 11.6–15.4)
WBC: 10.9 10*3/uL — ABNORMAL HIGH (ref 3.4–10.8)

## 2019-09-30 LAB — LIPID PANEL
Chol/HDL Ratio: 3.4 ratio (ref 0.0–5.0)
Cholesterol, Total: 138 mg/dL (ref 100–199)
HDL: 41 mg/dL (ref >39)
LDL Chol Calc (NIH): 73 mg/dL (ref 0–99)
Triglycerides: 137 mg/dL (ref 0–149)
VLDL Cholesterol Cal: 24 mg/dL (ref 5–40)

## 2019-10-01 ENCOUNTER — Telehealth: Payer: Self-pay | Admitting: Cardiology

## 2019-10-23 ENCOUNTER — Ambulatory Visit
Admission: RE | Admit: 2019-10-23 | Discharge: 2019-10-23 | Disposition: A | Discharge status: Home / Self Care | Payer: BLUE CROSS/BLUE SHIELD | Source: Ambulatory Visit | Attending: Cardiology | Admitting: Cardiology

## 2019-10-23 DIAGNOSIS — I712 Thoracic aortic aneurysm, without rupture, unspecified: Secondary | ICD-10-CM

## 2019-10-23 MED ORDER — IOPAMIDOL (ISOVUE-370) INJECTION 76%
75.0000 mL | Freq: Once | INTRAVENOUS | Status: AC | PRN
  Administered 2019-10-23: 75 mL via INTRAVENOUS

## 2019-11-04 ENCOUNTER — Telehealth: Payer: Self-pay | Admitting: Cardiology

## 2019-11-04 DIAGNOSIS — Z0000 Encounter for general adult medical examination without abnormal findings: Secondary | ICD-10-CM | POA: Diagnosis not present

## 2019-11-04 DIAGNOSIS — I10 Essential (primary) hypertension: Secondary | ICD-10-CM | POA: Diagnosis not present

## 2019-11-04 DIAGNOSIS — Z8679 Personal history of other diseases of the circulatory system: Secondary | ICD-10-CM | POA: Diagnosis not present

## 2019-11-04 DIAGNOSIS — Z6831 Body mass index (BMI) 31.0-31.9, adult: Secondary | ICD-10-CM | POA: Diagnosis not present

## 2019-11-04 NOTE — Telephone Encounter (Signed)
I discussed CTA findings with residual dissection that is stable and appears chronic. Discussed with Dr. Virgina Jock, would like for CV surgery to evaluate and follow. Patient is agreeable to this. Will send referral back to his CV surgeon at [facility]

## 2019-11-04 NOTE — Telephone Encounter (Signed)
-----   Message from [facility], MD sent at 11/04/2019  3:41 PM EST ----- Residual dissection is noted. I suspect this is chronic, not acute. Reasonable to refer to vascular surgery to establish care, in light of this.  Thanks MJP

## 2019-11-04 NOTE — Progress Notes (Signed)
Residual dissection is noted. I suspect this is chronic, not acute. Reasonable to refer to vascular surgery to establish care, in light of this.  Thanks MJP

## 2019-11-05 NOTE — Telephone Encounter (Signed)
CTA will be scheduled

## 2019-11-09 ENCOUNTER — Telehealth: Payer: Self-pay

## 2019-11-09 NOTE — Telephone Encounter (Signed)
Spoke with the patient, I ensured the patient that I will personally try to discuss the recent CT scan results with his surgeons at [facility] and to discuss if any other follow up is needed.  Thanks MJP

## 2019-11-09 NOTE — Telephone Encounter (Signed)
Telephone encounter:  Reason for call: Pt called wanting further explanation regarding the residual dissection found on the CTA and if this is new or was there and why the surgery. Wanted to speak with [facility] directly so if you can call them. [phone]  Usual provider: [facility]  Last office visit: 09/29/19  Next office visit: 12/30/19   Last hospitalization: 09/09/18   Current Outpatient Medications on File Prior to Visit  Medication Sig Dispense Refill  . amLODipine (NORVASC) 10 MG tablet Take 5 mg by mouth daily.     Marland Kitchen aspirin (ASPIRIN LOW DOSE) 81 MG EC tablet TAKE 1 TABLET(81 MG) BY MOUTH DAILY 90 tablet 1  . atorvastatin (LIPITOR) 40 MG tablet Take 1 tablet (40 mg total) by mouth daily. 90 tablet 3  . hydrALAZINE (APRESOLINE) 50 MG tablet TAKE 1 TABLET(50 MG) BY MOUTH THREE TIMES DAILY 270 tablet 1  . ibuprofen (ADVIL,MOTRIN) 200 MG tablet Take 4,000 mg by mouth every 6 (six) hours as needed for moderate pain.     Marland Kitchen losartan (COZAAR) 100 MG tablet TAKE 1 TABLET(100 MG) BY MOUTH TWICE DAILY 90 tablet 0  . metoprolol tartrate (LOPRESSOR) 100 MG tablet Take 1 tablet (100 mg total) by mouth 2 (two) times daily. 180 tablet 3   No current facility-administered medications on file prior to visit.

## 2019-11-16 ENCOUNTER — Other Ambulatory Visit: Payer: Self-pay | Admitting: Cardiology

## 2019-11-25 ENCOUNTER — Other Ambulatory Visit: Payer: Self-pay | Admitting: Cardiology

## 2019-12-10 DIAGNOSIS — I7101 Dissection of thoracic aorta: Secondary | ICD-10-CM | POA: Diagnosis not present

## 2019-12-28 IMAGING — CT CT ANGIO CHEST
4 of 8 series · 17 of 36 positions shown · IV contrast (iopamidol)
Comparison: September 02, 2018.

CLINICAL DATA: Thoracic aortic aneurysm.  Status post dissection.

EXAM:
CT ANGIOGRAPHY CHEST WITH CONTRAST
TECHNIQUE: Multidetector CT imaging of the chest was performed using the
standard protocol during bolus administration of intravenous
contrast. Multiplanar CT image reconstructions and MIPs were
obtained to evaluate the vascular anatomy.
CONTRAST:  75mL [id number] IOPAMIDOL ([id number]) INJECTION 76%

[Series 5: chest w/o 2.00 br40 s3 axial · axial · non-contrast · 0.68mm/px · z∈[+1841,+1881]mm · 2 of 79 slices shown]
[im 20/79  lung]
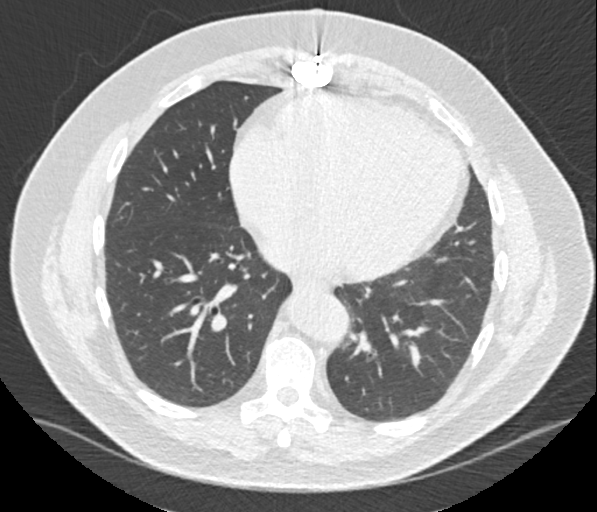
[im 40/79  lung]
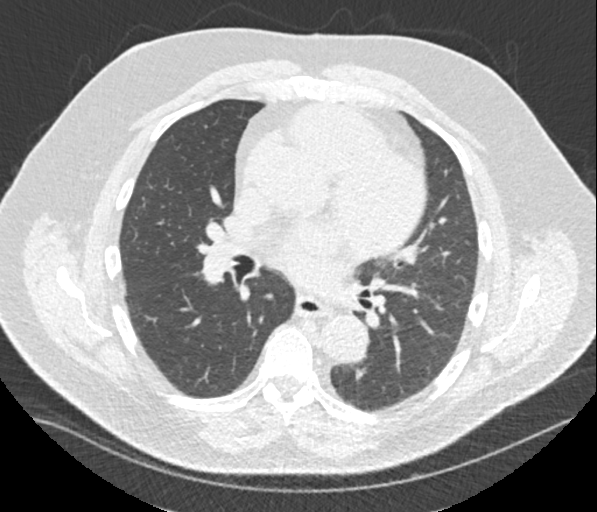

[Series 9: cta thorax 2.00 bv36 s3 axial arterial · axial · arterial · 0.74mm/px · z∈[+1748,+1976]mm · 7 of 153 slices shown]
[im 20/153  lung]
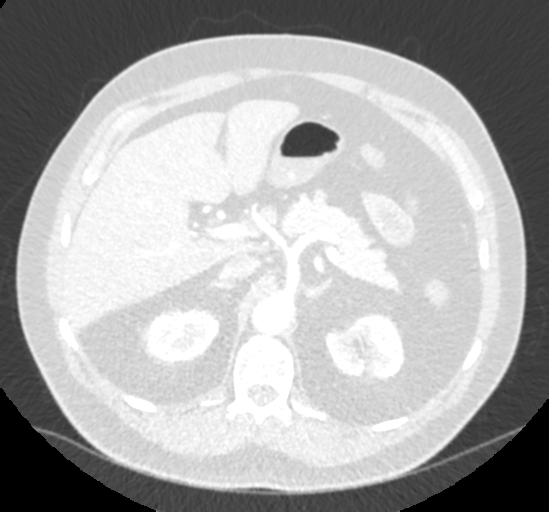
[im 39/153  mediastinal]
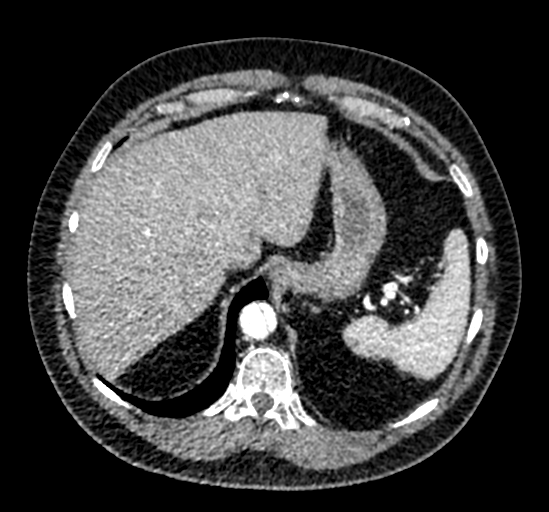
[im 58/153  lung]
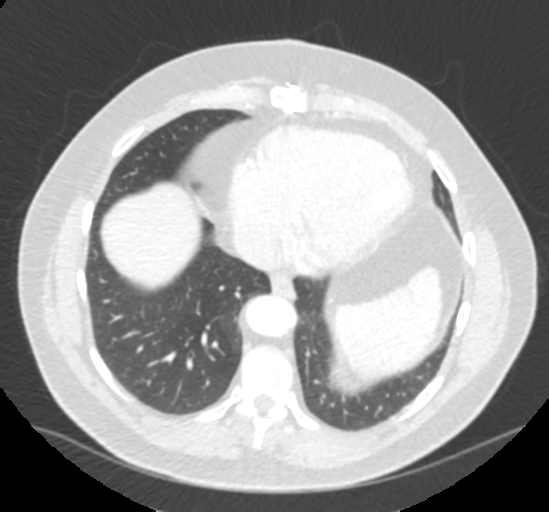
[im 77/153  mediastinal]
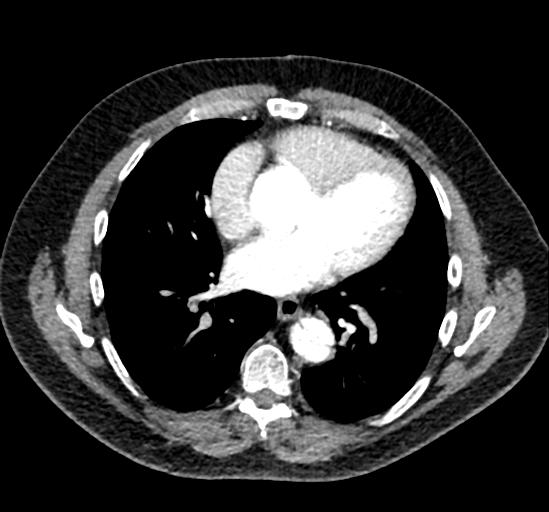
[im 96/153  lung]
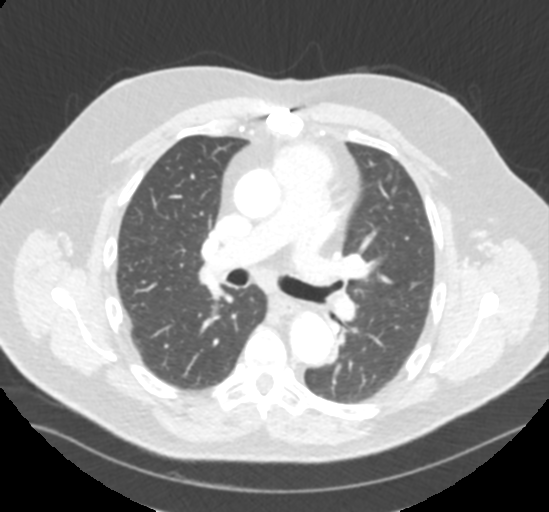
[im 115/153  mediastinal]
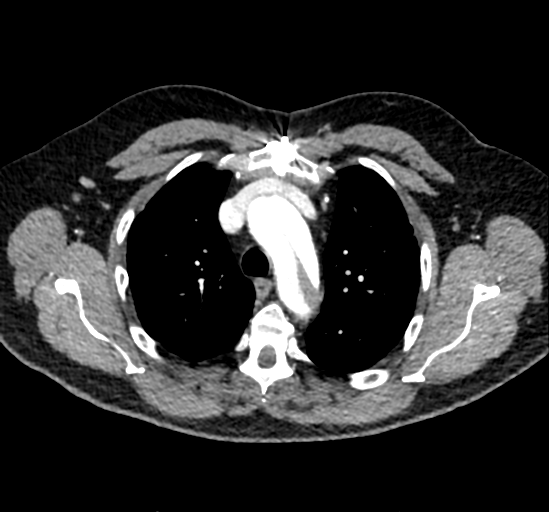
[im 134/153  lung]
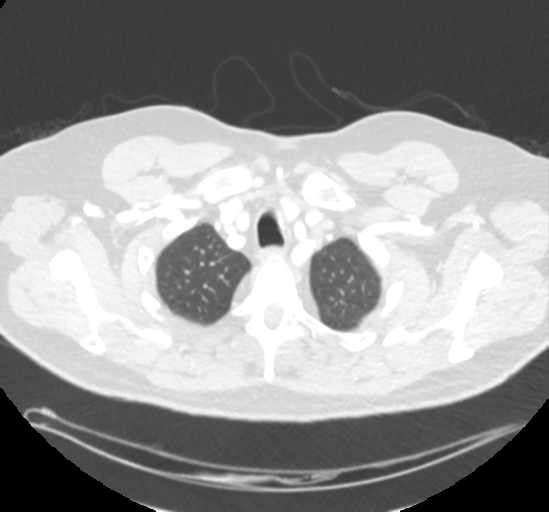

[Series 11: cta thorax 2.00 br60 s3 axial arterial · axial · arterial · 0.74mm/px · z∈[+1748,+1976]mm · 7 of 153 slices shown]
[im 20/153  lung]
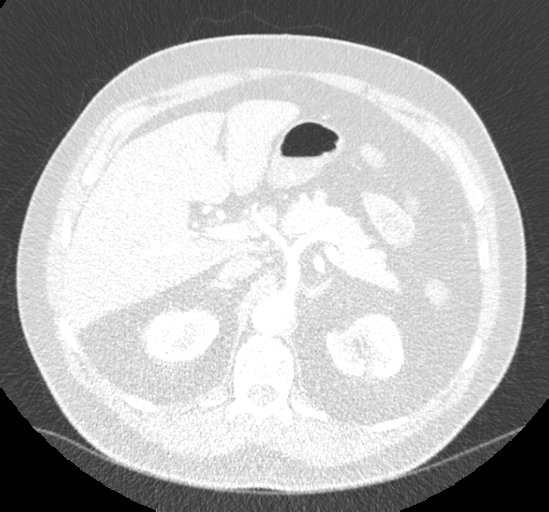
[im 39/153  lung]
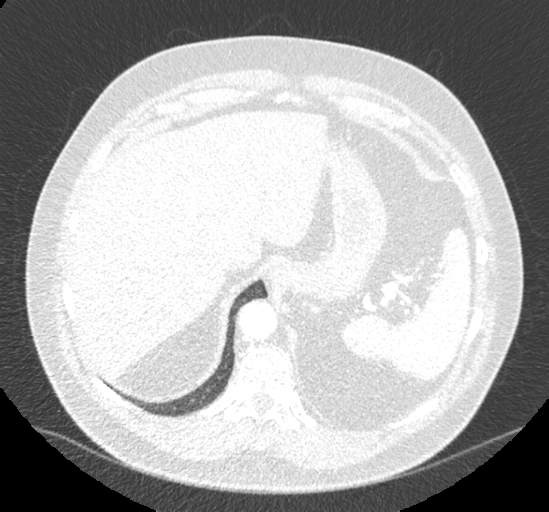
[im 58/153  lung]
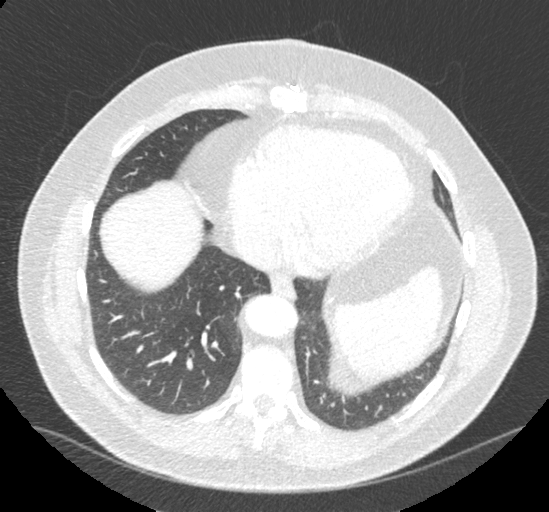
[im 77/153  lung]
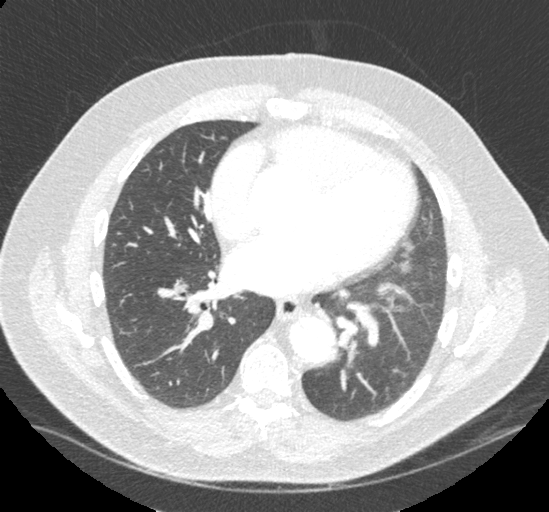
[im 96/153  lung]
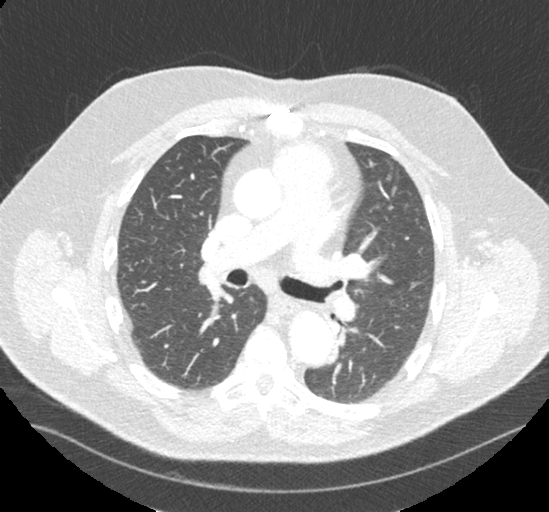
[im 115/153  lung]
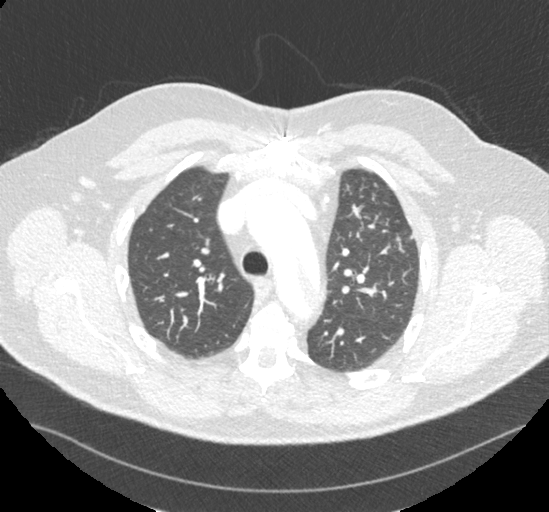
[im 134/153  lung]
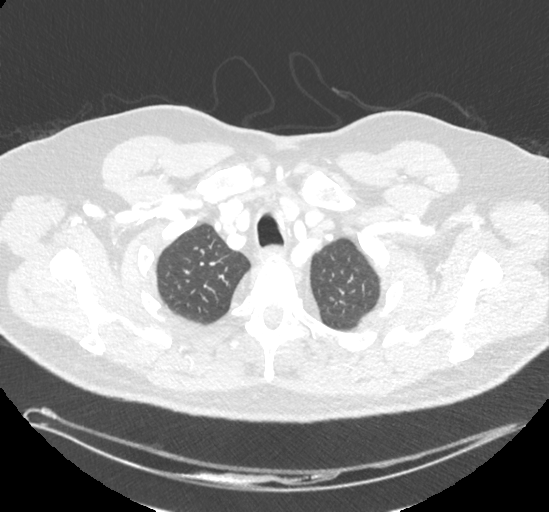

[Series 14: cta thorax 2.00 bv36 s3 cor st · coronal · 0.60mm/px · 1 of 189 slices shown]
[im 95/189  mediastinal]
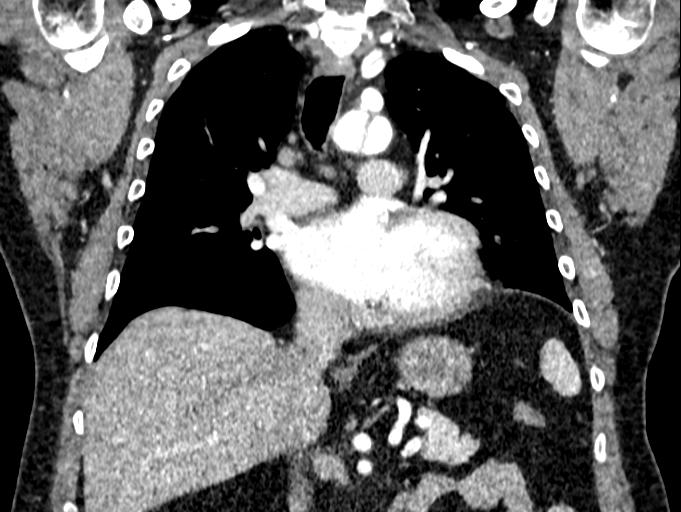

[17 of 36 positions shown; findings below may reference images not displayed]

FINDINGS: Cardiovascular: Status post surgical repair of ascending thoracic
aortic dissection. There does remain residual dissection which
begins just proximal to the origin of the right innominate artery.
Dissection flap is seen to extend into the left subclavian artery
which is unchanged compared to prior exam. The dissection is seen to
extend throughout the descending thoracic aorta. Aortic root is
dilated at 4.3 cm. Normal cardiac size. No pericardial effusion.

Mediastinum/Nodes: No enlarged mediastinal, hilar, or axillary lymph
nodes. Thyroid gland, trachea, and esophagus demonstrate no
significant findings.

Lungs/Pleura: Lungs are clear. No pleural effusion or pneumothorax.

Upper Abdomen: No acute abnormality.

Musculoskeletal: No chest wall abnormality. No acute or significant
osseous findings.

Review of the MIP images confirms the above findings.
IMPRESSION: Status post surgical Tiger of ascending thoracic aortic dissection
and aneurysm noted on prior exam. Residual dissection remains which
begins just proximal to the origin of the right innominate artery
and extends through the transverse aortic arch and descending
thoracic aorta into the abdominal aorta. Dissection flap is seen to
extend into the proximal left subclavian artery which is unchanged
compared to prior exam. Aortic root is mildly dilated at 4.3 cm. No
other aneurysm is noted throughout the thoracic aorta.

## 2019-12-30 ENCOUNTER — Other Ambulatory Visit: Payer: Self-pay

## 2019-12-30 ENCOUNTER — Ambulatory Visit (INDEPENDENT_AMBULATORY_CARE_PROVIDER_SITE_OTHER): Payer: BC Managed Care – PPO | Admitting: Cardiology

## 2019-12-30 ENCOUNTER — Encounter: Payer: Self-pay | Admitting: Cardiology

## 2019-12-30 VITALS — BP 153/77 | HR 69 | Temp 98.0°F | Ht 73.0 in | Wt 242.9 lb

## 2019-12-30 DIAGNOSIS — E782 Mixed hyperlipidemia: Secondary | ICD-10-CM | POA: Diagnosis not present

## 2019-12-30 DIAGNOSIS — I10 Essential (primary) hypertension: Secondary | ICD-10-CM | POA: Diagnosis not present

## 2019-12-30 DIAGNOSIS — Z9889 Other specified postprocedural states: Secondary | ICD-10-CM | POA: Diagnosis not present

## 2019-12-30 DIAGNOSIS — Z8679 Personal history of other diseases of the circulatory system: Secondary | ICD-10-CM | POA: Diagnosis not present

## 2019-12-30 NOTE — Progress Notes (Signed)
Primary Physician:  Nicholes Rough, PA-C   Patient ID: Alexis Terry, male    DOB: 11-22-1970, 49 y.o.   MRN: [id number]  Subjective:    Chief Complaint  Patient presents with  . Hypertension  . Thoracic Aortic Dissection    history  . Follow-up    3 month    HPI: Alexis Terry  is a 49 y.o. male  with hypertension diagnosed in February 2019 that by report was poorly controlled presented with hypertensive emergency and found to have type A aortic dissection on 09/02/2018 and underwent replacement of ascending aorta and hemiarch under [facility] with Dr. Lunette Stands at [facility] on 09/02/2018. Had severe back pain prior to being seen in the ER.    Now presents for 75-month office visit and follow-up for hypertension. Since decreasing his dose of amlodipine, leg swelling has improved. BP ranges from 983-382 systolic. During the interim he was evaluated by CV surgery, has chronic residual dissection that is stable. He will have follow-up CTA of the chest in 2 years and follow-up at that time.  Presented to [facility] in 2011 with left sided eye droop and visual changes. MRI of brain was negative. Diagnosis was unsure. Has not had any reoccurence of symptoms since.  He is now retired. He has been exercising with regular walking and skating on the weekends.  Past Medical History:  Diagnosis Date  . Bilateral carotid bruits   . Hypertension   . S/P aortic dissection repair   . Stroke [facility]) 2011   patient's wife reports "small stroke involving his eye" about 8 years ago, no deficits noted    Past Surgical History:  Procedure Laterality Date  . REPAIR OF ACUTE ASCENDING THORACIC AORTIC DISSECTION      Social History   Socioeconomic History  . Marital status: Married    Spouse name: Not on file  . Number of children: 1  . Years of education: Not on file  . Highest education level: Not on file  Occupational History  . Not on file  Tobacco Use  . Smoking status: Never Smoker  .  Smokeless tobacco: Never Used  Substance and Sexual Activity  . Alcohol use: Not Currently  . Drug use: Never  . Sexual activity: Not on file  Other Topics Concern  . Not on file  Social History Narrative  . Not on file   Social Determinants of Health   Financial Resource Strain:   . Difficulty of Paying Living Expenses: Not on file  Food Insecurity:   . Worried About Charity fundraiser in the Last Year: Not on file  . Ran Out of Food in the Last Year: Not on file  Transportation Needs:   . Lack of Transportation (Medical): Not on file  . Lack of Transportation (Non-Medical): Not on file  Physical Activity:   . Days of Exercise per Week: Not on file  . Minutes of Exercise per Session: Not on file  Stress:   . Feeling of Stress : Not on file  Social Connections:   . Frequency of Communication with Friends and Family: Not on file  . Frequency of Social Gatherings with Friends and Family: Not on file  . Attends Religious Services: Not on file  . Active Member of Clubs or Organizations: Not on file  . Attends Archivist Meetings: Not on file  . Marital Status: Not on file  Intimate Partner Violence:   . Fear of Current or Ex-Partner: Not on  file  . Emotionally Abused: Not on file  . Physically Abused: Not on file  . Sexually Abused: Not on file    Review of Systems  Constitution: Negative for decreased appetite, malaise/fatigue, weight gain and weight loss.  Eyes: Negative for visual disturbance.  Cardiovascular: Positive for leg swelling (improved). Negative for chest pain, claudication, dyspnea on exertion, orthopnea, palpitations and syncope.  Respiratory: Negative for hemoptysis and wheezing.   Endocrine: Negative for cold intolerance and heat intolerance.  Hematologic/Lymphatic: Does not bruise/bleed easily.  Skin: Negative for nail changes.  Musculoskeletal: Negative for muscle weakness and myalgias.  Gastrointestinal: Negative for abdominal pain, change  in bowel habit, nausea and vomiting.  Neurological: Negative for difficulty with concentration, dizziness, focal weakness and headaches.  Psychiatric/Behavioral: Negative for altered mental status and suicidal ideas.  All other systems reviewed and are negative.     Objective:  Blood pressure (!) 153/77, pulse 69, temperature 98 F (36.7 C), height 6\' 1"  (1.854 m), weight 242 lb 14.4 oz (110.2 kg), SpO2 97 %. Body mass index is 32.05 kg/m.    Physical Exam  Constitutional: He is oriented to person, place, and time. Vital signs are normal. He appears well-developed and well-nourished.  HENT:  Head: Normocephalic and atraumatic.  Cardiovascular: Normal rate, regular rhythm and intact distal pulses.  Murmur heard.  Early systolic murmur is present with a grade of 1/6 at the upper right sternal border. Pulses:      Carotid pulses are on the right side with bruit and on the left side with bruit. Trace edema  Pulmonary/Chest: Effort normal and breath sounds normal. No accessory muscle usage. No respiratory distress.  Abdominal: Soft. Bowel sounds are normal.  Musculoskeletal:        General: Normal range of motion.     Cervical back: Normal range of motion.  Neurological: He is alert and oriented to person, place, and time.  Skin: Skin is warm and dry.  Vitals reviewed.  Radiology: No results found.  Laboratory examination:    CMP Latest Ref Rng & Units 09/29/2019 09/02/2018 06/17/2010  Glucose 65 - 99 mg/dL 06/19/2010) 568(L) 275(T)  BUN 6 - 24 mg/dL 19 18 7   Creatinine 0.76 - 1.27 mg/dL 700(F ) 7.49  Sodium 134 - 144 mmol/L 139 138 140  Potassium 3.5 - 5.2 mmol/L 5.0 5.0 4.2  Chloride 96 - 106 mmol/L 103 103 106  CO2 20 - 29 mmol/L 22 22 26   Calcium 8.7 - 10.2 mg/dL 4.49(Q 9.8 9.0  Total Protein 6.0 - 8.5 g/dL 7.5 7.3 7.1  Total Bilirubin 0.0 - 1.2 mg/dL 0.6 0.8 0.5  Alkaline Phos 39 - 117 IU/L 125(H) 75 87  AST 0 - 40 IU/L 23 33 24  ALT 0 - 44 IU/L 21 26 21    CBC Latest  Ref Rng & Units 09/29/2019 09/02/2018 06/17/2010  WBC 3.4 - 10.8 x10E3/uL 10.9(H) 23.0(H) 8.9  Hemoglobin 13.0 - 17.7 g/dL 13/08/2019 09/04/2018  Hematocrit 37.5 - 51.0 % 42.5 46.8 43.5  Platelets 150 - 450 x10E3/uL 283 275 210   Lipid Panel     Component Value Date/Time   CHOL 138 09/29/2019 1045   TRIG 137 09/29/2019 1045   HDL 41 09/29/2019 1045   CHOLHDL 3.4 09/29/2019 1045   CHOLHDL 4.3 06/17/2010 0545   VLDL 10 06/17/2010 0545   LDLCALC 73 09/29/2019 1045   HEMOGLOBIN A1C Lab Results  Component Value Date   HGBA1C (H) 06/17/2010    5.7 (NOTE)  According to the ADA Clinical Practice Recommendations for 2011, when HbA1c is used as a screening test:   >=6.5%   Diagnostic of Diabetes Mellitus           (if abnormal result  is confirmed)  5.7-6.4%   Increased risk of developing Diabetes Mellitus  References:Diagnosis and Classification of Diabetes Mellitus,Diabetes Care,2011,34(Suppl 1):S62-S69 and Standards of Medical Care in         Diabetes - 2011,Diabetes Care,2011,34  (Suppl 1):S11-S61.   MPG 117 (H) 06/17/2010   TSH No results for input(s): TSH in the last 8760 hours.  PRN Meds:. Medications Discontinued During This Encounter  Medication Reason  . losartan (COZAAR) 100 MG tablet Error   Current Meds  Medication Sig  . amLODipine (NORVASC) 10 MG tablet TAKE 1 TABLET BY MOUTH DAILY  . aspirin (ASPIRIN LOW DOSE) 81 MG EC tablet TAKE 1 TABLET(81 MG) BY MOUTH DAILY  . atorvastatin (LIPITOR) 40 MG tablet Take 1 tablet (40 mg total) by mouth daily.  . hydrALAZINE (APRESOLINE) 50 MG tablet TAKE 1 TABLET(50 MG) BY MOUTH THREE TIMES DAILY  . losartan (COZAAR) 100 MG tablet Take 100 mg by mouth daily.  . metoprolol tartrate (LOPRESSOR) 100 MG tablet Take 1 tablet (100 mg total) by mouth 2 (two) times daily.    Cardiac Studies:    Echo- 10/23/2018 1. Left ventricle cavity is normal in size. Mild  concentric hypertrophy of the left ventricle. Normal global wall motion. Calculated EF 62%. 2. Left atrial cavity is mildly dilated. 3. Mild (Grade I) aortic regurgitation. 4. Mild (Grade I) mitral regurgitation. 5. Mild tricuspid regurgitation. Mild pulmonary hypertension with approx. PA syst. pressure of 33 mm of Hg. 6. The aortic root measures 3.8 cm. Pt. is s/p ascending aorta and hemiarch replacement. 7. IVC is dilated with respiratory variation, suggests elevated RA pressure.  CTA chest/abdomen/pelvis 09/02/2018: IMPRESSION: Acute Stanford type A thoracic aortic dissection starting from the aortic root which is aneurysmal in appearance up to 5.1 cm in caliber. Spiraling of the dissection flap is noted with involvement of dissection into the right brachiocephalic and left subclavian arteries.  Extension of dissection into the nonaneurysmal abdominal aorta, both common iliac and external iliac arteries as well as extension into the right internal iliac artery with occluded or near occluded appearance of the distal right common iliac artery and extension of the dissection into the left renal artery.  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  Carotid artery duplex 10/23/2018: Stenosis in the right external carotid artery (<50%). This is the source of bruit. Mild soft plaque noted in right carotid artery. Antegrade right vertebral artery flow. Antegrade left vertebral artery flow. Follow up studies is appropriate if clinically indicated.  Assessment:   Essential hypertension  Hx of repair of dissecting thoracic aortic aneurysm, Stanford type A  Mixed hyperlipidemia  EKG 09/29/2019: Normal sinus rhythm at 68 bpm, normal axis, PRWP cannot exclude anterior infarct old. No evidence of ischemia.   Recommendations:   Patient is here for 68-month office visit and follow-up for hypertension. His blood pressure is elevated in our office, but he  brings his home blood pressure readings that show consistently blood pressures well controlled with systolic readings in the 115-135 range. He is tolerating medications well. Since decreasing his dose of amlodipine, but swelling has significantly improved. We will continue with his present medications. I reviewed his labs that were performed in November with PCP, kidney function is stable. Lipids are also well controlled.  He has been evaluated by CV surgery, as chronic residual dissection that is stable. Encouraged him to continue with diet and exercise. Pleased with his progress. We will see him back in 6 months for follow-up, but encouraged him to contact us sooner if needed.    Toniann Fail, MSN, APRN, FNP-C [facility] Office: [phone] Fax: [phone]

## 2020-01-06 ENCOUNTER — Other Ambulatory Visit: Payer: Self-pay | Admitting: Cardiology

## 2020-02-05 ENCOUNTER — Ambulatory Visit: Payer: BC Managed Care – PPO | Attending: Internal Medicine

## 2020-02-05 DIAGNOSIS — Z23 Encounter for immunization: Secondary | ICD-10-CM

## 2020-02-05 NOTE — Progress Notes (Signed)
   Covid-19 Vaccination Clinic  Name:  Alexis Terry    MRN: [id number] DOB: 1971-06-27  02/05/2020  [name] was observed post Covid-19 immunization for 15 minutes without incident. He was provided with Vaccine Information Sheet and instruction to access the V-Safe system.   [name] was instructed to call 911 with any severe reactions post vaccine: Marland Kitchen Difficulty breathing  . Swelling of face and throat  . A fast heartbeat  . A bad rash all over body  . Dizziness and weakness   Immunizations Administered    Name Date Dose VIS Date Route   Pfizer COVID-19 Vaccine 02/05/2020  9:04 AM 0.3 mL 10/30/2019 Intramuscular   Manufacturer: [employer], [employer]   Lot: [id number]   NDC: [address]-8

## 2020-02-10 ENCOUNTER — Other Ambulatory Visit: Payer: Self-pay | Admitting: Cardiology

## 2020-03-01 ENCOUNTER — Ambulatory Visit: Payer: BC Managed Care – PPO | Attending: Internal Medicine

## 2020-03-01 DIAGNOSIS — Z23 Encounter for immunization: Secondary | ICD-10-CM

## 2020-03-01 NOTE — Progress Notes (Signed)
   Covid-19 Vaccination Clinic  Name:  Alexis Terry    MRN: [id number] DOB: 1971-11-16  03/01/2020  [name] was observed post Covid-19 immunization for 15 minutes without incident. He was provided with Vaccine Information Sheet and instruction to access the V-Safe system.   [name] was instructed to call 911 with any severe reactions post vaccine: Marland Kitchen Difficulty breathing  . Swelling of face and throat  . A fast heartbeat  . A bad rash all over body  . Dizziness and weakness   Immunizations Administered    Name Date Dose VIS Date Route   Pfizer COVID-19 Vaccine 03/01/2020  9:12 AM 0.3 mL 10/30/2019 Intramuscular   Manufacturer: [employer], [employer]   Lot: [id number]   NDC: [address]-2

## 2020-03-28 ENCOUNTER — Other Ambulatory Visit: Payer: Self-pay | Admitting: Cardiology

## 2020-03-28 DIAGNOSIS — I10 Essential (primary) hypertension: Secondary | ICD-10-CM

## 2020-06-20 ENCOUNTER — Encounter: Payer: Self-pay | Admitting: Cardiology

## 2020-06-20 ENCOUNTER — Other Ambulatory Visit: Payer: Self-pay

## 2020-06-20 ENCOUNTER — Ambulatory Visit: Payer: BC Managed Care – PPO | Admitting: Cardiology

## 2020-06-20 VITALS — BP 145/80 | HR 61 | Resp 16 | Ht 73.0 in | Wt 248.0 lb

## 2020-06-20 DIAGNOSIS — Z9889 Other specified postprocedural states: Secondary | ICD-10-CM | POA: Diagnosis not present

## 2020-06-20 DIAGNOSIS — E782 Mixed hyperlipidemia: Secondary | ICD-10-CM | POA: Diagnosis not present

## 2020-06-20 DIAGNOSIS — E6609 Other obesity due to excess calories: Secondary | ICD-10-CM

## 2020-06-20 DIAGNOSIS — I10 Essential (primary) hypertension: Secondary | ICD-10-CM | POA: Diagnosis not present

## 2020-06-20 DIAGNOSIS — Z8679 Personal history of other diseases of the circulatory system: Secondary | ICD-10-CM

## 2020-06-20 MED ORDER — LOSARTAN POTASSIUM 100 MG PO TABS: 100.0000 mg | ORAL_TABLET | Freq: Every day | ORAL | 0 refills | Status: DC

## 2020-06-20 MED ORDER — CHLORTHALIDONE 25 MG PO TABS: 12.5000 mg | ORAL_TABLET | Freq: Every morning | ORAL | 0 refills | Status: DC

## 2020-06-20 MED ORDER — METOPROLOL TARTRATE 100 MG PO TABS: 100.0000 mg | ORAL_TABLET | Freq: Two times a day (BID) | ORAL | 1 refills | Status: DC

## 2020-06-20 MED ORDER — ATORVASTATIN CALCIUM 40 MG PO TABS: 40.0000 mg | ORAL_TABLET | Freq: Every evening | ORAL | 1 refills | Status: DC

## 2020-06-20 NOTE — Progress Notes (Signed)
Alexis Terry Date of Birth: May 16, 1971 MRN: [id number] Primary Care Provider:Beal, Christa See Former Cardiology Providers: Altamese Gravois Mills, APRN, FNP-C Primary Cardiologist: Tessa Lerner, DO, [facility] (established care 06/20/2020) Cardiothoracic Surgeon: Dr. Aurea Graff  Date: 06/20/20 Last Visit: 12/30/2019  Chief Complaint  Patient presents with  . Follow-up    6 month  . Hypertension    HPI  Alexis Terry is a 49 y.o.  male who presents to the office with a chief complaint of " blood pressure follow-up." Patient's past medical history and cardiovascular risk factors include: History of type A aortic dissection status post replacement of the ascending aorta and hemiarch under [employer] with Dr. Ty Hilts at [facility], mixed hyperlipidemia, hypertension, obesity due to excess calories.  Was last seen in the office back in February 2021 by Phineas Semen and is here for follow-up and to reestablish care with myself as I am seeing him for the first time for the above-mentioned chief complaint.  At the last office visit patient states that his blood pressure were well controlled at home with systolic blood pressures ranging between 115-135 mmHg.  His lower extremity swelling has also improved since decreasing amlodipine in the past.  Since last office visit patient states that he is doing well overall from a cardiovascular standpoint.  He states that his blood pressure continued to be labile.  Reviewed his blood pressure log with him at today's office visit.  His systolic blood pressures are consistently less than 140 mmHg.  Patient states that he tries to consume a low-salt diet.  He plans to increase his physical activity by riding his bike often.  No recent hospitalizations or urgent care visits for cardiovascular symptoms.  He has a follow-up appointment with his cardiothoracic surgeon December 2022 with a CT scan prior to that the follow-up on his history of ascending aortic dissection status post  replacement of ascending aorta and hemiarch.  ALLERGIES: No Known Allergies   MEDICATION LIST PRIOR TO VISIT: Current Outpatient Medications on File Prior to Visit  Medication Sig Dispense Refill  . amLODipine (NORVASC) 5 MG tablet TAKE 1 TABLET(5 MG) BY MOUTH DAILY 90 tablet 1  . aspirin (ASPIRIN LOW DOSE) 81 MG EC tablet TAKE 1 TABLET(81 MG) BY MOUTH DAILY 90 tablet 1  . hydrALAZINE (APRESOLINE) 50 MG tablet TAKE 1 TABLET(50 MG) BY MOUTH THREE TIMES DAILY 270 tablet 1  . ibuprofen (ADVIL,MOTRIN) 200 MG tablet Take 4,000 mg by mouth every 6 (six) hours as needed for moderate pain.      No current facility-administered medications on file prior to visit.    PAST MEDICAL HISTORY: Past Medical History:  Diagnosis Date  . Bilateral carotid bruits   . Hypertension   . S/P aortic dissection repair   . Stroke [facility]) 2011   patient's wife reports "small stroke involving his eye" about 8 years ago, no deficits noted    PAST SURGICAL HISTORY: Past Surgical History:  Procedure Laterality Date  . REPAIR OF ACUTE ASCENDING THORACIC AORTIC DISSECTION      FAMILY HISTORY: The patient's family history includes Hypertension in his father and mother.   SOCIAL HISTORY:  The patient  reports that he has never smoked. He has never used smokeless tobacco. He reports previous alcohol use. He reports that he does not use drugs.  Review of Systems  Constitutional: Negative for chills and fever.  HENT: Negative for hoarse voice and nosebleeds.   Eyes: Negative for discharge, double vision and pain.  Cardiovascular: Negative  for chest pain, claudication, dyspnea on exertion, leg swelling, near-syncope, orthopnea, palpitations, paroxysmal nocturnal dyspnea and syncope.  Respiratory: Negative for hemoptysis and shortness of breath.   Musculoskeletal: Negative for muscle cramps and myalgias.  Gastrointestinal: Negative for abdominal pain, constipation, diarrhea, hematemesis, hematochezia, melena,  nausea and vomiting.  Neurological: Negative for dizziness and light-headedness.    PHYSICAL EXAM: Vitals with BMI 06/20/2020 06/20/2020 12/30/2019  Height - 6\' 1"  6\' 1"   Weight - 248 lbs 242 lbs 14 oz  BMI - 32.73 32.05  Systolic 145 167  Diastolic 80 78 77  Pulse 61 67 69   CONSTITUTIONAL: Well-developed and well-nourished. No acute distress.  SKIN: Skin is warm and dry. No rash noted. No cyanosis. No pallor. No jaundice HEAD: Normocephalic and atraumatic.  EYES: No scleral icterus MOUTH/THROAT: Moist oral membranes.  NECK: No JVD present. No thyromegaly noted. No carotid bruits  LYMPHATIC: No visible cervical adenopathy.  CHEST Normal respiratory effort. No intercostal retractions.  Sternotomy site is healed well. LUNGS: Clear to auscultation bilaterally.  No stridor. No wheezes. No rales.  CARDIOVASCULAR: Regular, positive S1-S2, no murmurs rubs or gallops appreciated. ABDOMINAL: No apparent ascites.  EXTREMITIES: No peripheral edema  HEMATOLOGIC: No significant bruising NEUROLOGIC: Oriented to person, place, and time. Nonfocal. Normal muscle tone.  PSYCHIATRIC: Normal mood and affect. Normal behavior. Cooperative  RADIOLOGY: CTA chest/abdomen/pelvis 09/02/2018: IMPRESSION: Acute Stanford type A thoracic aortic dissection starting from the aortic root which is aneurysmal in appearance up to 5.1 cm in caliber. Spiraling of the dissection flap is noted with involvement of dissection into the right brachiocephalic and left subclavian arteries.  Extension of dissection into the nonaneurysmal abdominal aorta, both common iliac and external iliac arteries as well as extension into the right internal iliac artery with occluded or near occluded appearance of the distal right common iliac artery and extension of the dissection into the left renal artery.  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  10/23/2019 CTA chest: Status post surgical Pera of ascending  thoracic aortic dissection and aneurysm noted on prior exam. Residual dissection remains which begins just proximal to the origin of the right innominate artery and extends through the transverse aortic arch and descending thoracic aorta into the abdominal aorta. Dissection flap is seen to extend into the proximal left subclavian artery which is unchanged compared to prior exam. Aortic root is mildly dilated at 4.3 cm. No other aneurysm is noted throughout the thoracic aorta.  CARDIAC DATABASE: EKG: 06/20/2020: Normal sinus rhythm, 66 bpm, incomplete right bundle branch block, left atrial enlargement, without underlying ischemia or injury pattern.  Echocardiogram: 10/23/2018: LVEF 62%, mild LVH, mildly dilated left atrium, mild AR, mild MR, mild TR, PA SP 33 mmHg, aortic root measured 3.8 cm status post ascending aorta and hemiarch replacement.  Carotid artery duplex 10/23/2018: Stenosis in the right external carotid artery (<50%). This is the source of bruit. Mild soft plaque noted in right carotid artery. Antegrade right vertebral artery flow. Antegrade left vertebral artery flow. Follow up studies is appropriate if clinically indicated.  LABORATORY DATA: CBC Latest Ref Rng & Units 09/29/2019 09/02/2018 06/17/2010  WBC 3.4 - 10.8 x10E3/uL 10.9(H) 23.0(H) 8.9  Hemoglobin 13.0 - 17.7 g/dL 09/04/2018 06/19/2010 34.1  Hematocrit 37.5 - 51.0 % 42.5 46.8 43.5  Platelets 150 - 450 x10E3/uL 283 275 210    CMP Latest Ref Rng & Units 09/29/2019 09/02/2018 06/17/2010  Glucose 65 - 99 mg/dL 09/04/2018) 06/19/2010) 409(B)  BUN 6 - 24 mg/dL  19 18 7   Creatinine 0.76 - 1.27 mg/dL 0.861.12 5.78(I1.36(H) 6.960.88  Sodium 134 - 144 mmol/L 139 138 140  Potassium 3.5 - 5.2 mmol/L 5.0 5.0 4.2  Chloride 96 - 106 mmol/L 103 103 106  CO2 20 - 29 mmol/L 22 22 26   Calcium 8.7 - 10.2 mg/dL 29.510.2 9.8 9.0  Total Protein 6.0 - 8.5 g/dL 7.5 7.3 7.1  Total Bilirubin 0.0 - 1.2 mg/dL 0.6 0.8 0.5  Alkaline Phos 39 - 117 IU/L 125(H) 75 87  AST 0 - 40 IU/L  23 33 24  ALT 0 - 44 IU/L 21 26 21     Lipid Panel     Component Value Date/Time   CHOL 138 09/29/2019 1045   TRIG 137 09/29/2019 1045   HDL 41 09/29/2019 1045   CHOLHDL 3.4 09/29/2019 1045   CHOLHDL 4.3 06/17/2010 0545   VLDL 10 06/17/2010 0545   LDLCALC 73 09/29/2019 1045   LABVLDL 24 09/29/2019 1045    Lab Results  Component Value Date   HGBA1C (H) 06/17/2010    5.7 (NOTE)                                                                       According to the ADA Clinical Practice Recommendations for 2011, when HbA1c is used as a screening test:   >=6.5%   Diagnostic of Diabetes Mellitus           (if abnormal result  is confirmed)  5.7-6.4%   Increased risk of developing Diabetes Mellitus  References:Diagnosis and Classification of Diabetes Mellitus,Diabetes Care,2011,34(Suppl 1):S62-S69 and Standards of Medical Care in         Diabetes - 2011,Diabetes Care,2011,34  (Suppl 1):S11-S61.   No components found for: NTPROBNP No results found for: TSH  Cardiac Panel (last 3 results) No results for input(s): CKTOTAL, CKMB, TROPONINIHS, RELINDX in the last 72 hours.  IMPRESSION:    ICD-10-CM   1. Essential hypertension  I10 chlorthalidone (HYGROTON) 25 MG tablet    Basic metabolic panel    Magnesium    metoprolol tartrate (LOPRESSOR) 100 MG tablet    Magnesium    Basic metabolic panel  2. Hx of repair of dissecting thoracic aortic aneurysm, Stanford type A  Z98.890 EKG 12-Lead   Z86.79 chlorthalidone (HYGROTON) 25 MG tablet    Basic metabolic panel    Magnesium    Magnesium    Basic metabolic panel  3. Mixed hyperlipidemia  E78.2   4. Class 1 obesity due to excess calories with serious comorbidity and body mass index (BMI) of 32.0 to 32.9 in adult  E66.09    Z68.32      RECOMMENDATIONS: Alexis HidesRobert W Yun is a 49 y.o. male whose past medical history and cardiovascular risk factors include: History of type A aortic dissection status post replacement of the ascending aorta and  hem[facility]arch [facility] [employer] [facility], mixed hyperlipidemia, hypertension, obesity due to excess calories.  Benign essential hypertension:  Patient is office blood pressures are not well controlled.  Reviewed his home blood pressure logs which notes a better blood pressure management/control at home.  Still not at goal.  Given his history of type B aortic dissection I recommended the patient's systolic  blood pressures be around 120 mmHg if able to tolerate.  Medications reconciled.  Patient is asked to take the Norvasc in the evening.  We will start chlorthalidone 12.5 mg p.o. every morning.  Repeat blood work in 1 week to evaluate kidney function and electrolytes.  Educated on importance of low-salt diet.  Patient requesting a refill on metoprolol.  Recommend follow-up in 6 weeks to uptitrate antihypertensive medications if needed.    Mixed hyperlipidemia: Refilled Lipitor.  History of type A aortic dissection status post repair: Patient is encouraged to follow-up with his cardiothoracic surgeon as recommended.  FINAL MEDICATION LIST END OF ENCOUNTER: Meds ordered this encounter  Medications  . losartan (COZAAR) 100 MG tablet    Sig: Take 1 tablet (100 mg total) by mouth daily.    Dispense:  90 tablet    Refill:  0  . chlorthalidone (HYGROTON) 25 MG tablet    Sig: Take 0.5 tablets (12.5 mg total) by mouth in the morning.    Dispense:  15 tablet    Refill:  0  . atorvastatin (LIPITOR) 40 MG tablet    Sig: Take 1 tablet (40 mg total) by mouth at bedtime.    Dispense:  90 tablet    Refill:  1  . metoprolol tartrate (LOPRESSOR) 100 MG tablet    Sig: Take 1 tablet (100 mg total) by mouth 2 (two) times daily.    Dispense:  180 tablet    Refill:  1    Medications Discontinued During This Encounter  Medication Reason  . losartan (COZAAR) 100 MG tablet Dose change  . amLODipine (NORVASC) 10 MG tablet Change in therapy  . atorvastatin (LIPITOR) 40 MG tablet  Reorder  . metoprolol tartrate (LOPRESSOR) 100 MG tablet Reorder     Current Outpatient Medications:  .  amLODipine (NORVASC) 5 MG tablet, TAKE 1 TABLET(5 MG) BY MOUTH DAILY, Disp: 90 tablet, Rfl: 1 .  aspirin (ASPIRIN LOW DOSE) 81 MG EC tablet, TAKE 1 TABLET(81 MG) BY MOUTH DAILY, Disp: 90 tablet, Rfl: 1 .  atorvastatin (LIPITOR) 40 MG tablet, Take 1 tablet (40 mg total) by mouth at bedtime., Disp: 90 tablet, Rfl: 1 .  hydrALAZINE (APRESOLINE) 50 MG tablet, TAKE 1 TABLET(50 MG) BY MOUTH THREE TIMES DAILY, Disp: 270 tablet, Rfl: 1 .  ibuprofen (ADVIL,MOTRIN) 200 MG tablet, Take 4,000 mg by mouth every 6 (six) hours as needed for moderate pain. , Disp: , Rfl:  .  metoprolol tartrate (LOPRESSOR) 100 MG tablet, Take 1 tablet (100 mg total) by mouth 2 (two) times daily., Disp: 180 tablet, Rfl: 1 .  chlorthalidone (HYGROTON) 25 MG tablet, Take 0.5 tablets (12.5 mg total) by mouth in the morning., Disp: 15 tablet, Rfl: 0 .  losartan (COZAAR) 100 MG tablet, Take 1 tablet (100 mg total) by mouth daily., Disp: 90 tablet, Rfl: 0  Orders Placed This Encounter  Procedures  . Basic metabolic panel  . Magnesium  . EKG 12-Lead   --Continue cardiac medications as reconciled in final medication list. --Return in about 6 weeks (around 08/01/2020) for BP follow up.. Or sooner if needed. --Continue follow-up with your primary care physician regarding the management of your other chronic comorbid conditions.  Patient's questions and concerns were addressed to his satisfaction. He voices understanding of the instructions provided during this encounter.   This note was created using a voice recognition software as a result there may be grammatical errors inadvertently enclosed that do not reflect the nature of this encounter. Every  attempt is made to correct such errors.  Rex Kras, [place], [facility]  Pager: [phone] Office: [phone]

## 2020-06-28 DIAGNOSIS — I10 Essential (primary) hypertension: Secondary | ICD-10-CM | POA: Diagnosis not present

## 2020-06-28 DIAGNOSIS — Z8679 Personal history of other diseases of the circulatory system: Secondary | ICD-10-CM | POA: Diagnosis not present

## 2020-06-28 DIAGNOSIS — Z9889 Other specified postprocedural states: Secondary | ICD-10-CM | POA: Diagnosis not present

## 2020-06-29 LAB — BASIC METABOLIC PANEL
BUN/Creatinine Ratio: 23 — ABNORMAL HIGH (ref 9–20)
BUN: 26 mg/dL — ABNORMAL HIGH (ref 6–24)
CO2: 23 mmol/L (ref 20–29)
Calcium: 9.6 mg/dL (ref 8.7–10.2)
Chloride: 101 mmol/L (ref 96–106)
Creatinine, Ser: 1.15 mg/dL (ref 0.76–1.27)
GFR calc Af Amer: 85 mL/min/{1.73_m2} (ref >59)
GFR calc non Af Amer: 73 mL/min/{1.73_m2} (ref >59)
Glucose: 118 mg/dL — ABNORMAL HIGH (ref 65–99)
Potassium: 4.4 mmol/L (ref 3.5–5.2)
Sodium: 139 mmol/L (ref 134–144)

## 2020-06-29 LAB — MAGNESIUM: Magnesium: 2 mg/dL (ref 1.6–2.3)

## 2020-07-05 NOTE — Progress Notes (Signed)
Left voicemail for the patient to call back

## 2020-07-05 NOTE — Progress Notes (Signed)
Spoke with patient and he expressed understanding of results and plan. Reminded him to bring medication bottles to visit on 08/01/20.

## 2020-07-10 ENCOUNTER — Other Ambulatory Visit: Payer: Self-pay | Admitting: Cardiology

## 2020-07-10 DIAGNOSIS — Z8679 Personal history of other diseases of the circulatory system: Secondary | ICD-10-CM

## 2020-07-10 DIAGNOSIS — I10 Essential (primary) hypertension: Secondary | ICD-10-CM

## 2020-07-12 ENCOUNTER — Telehealth: Payer: Self-pay | Admitting: Student

## 2020-07-12 NOTE — Telephone Encounter (Signed)
Patient called to request a new prescription for chlorthalidone as he had no available refills. Informed patient that the pharmacy sent a request and that the new prescription was sent yesterday.

## 2020-07-13 ENCOUNTER — Other Ambulatory Visit: Payer: Self-pay | Admitting: Cardiology

## 2020-07-13 DIAGNOSIS — I10 Essential (primary) hypertension: Secondary | ICD-10-CM

## 2020-08-01 ENCOUNTER — Other Ambulatory Visit: Payer: Self-pay

## 2020-08-01 ENCOUNTER — Encounter: Payer: Self-pay | Admitting: Cardiology

## 2020-08-01 ENCOUNTER — Ambulatory Visit: Payer: BC Managed Care – PPO | Admitting: Cardiology

## 2020-08-01 VITALS — BP 137/76 | HR 66 | Ht 73.0 in | Wt 244.0 lb

## 2020-08-01 DIAGNOSIS — E782 Mixed hyperlipidemia: Secondary | ICD-10-CM | POA: Diagnosis not present

## 2020-08-01 DIAGNOSIS — Z8679 Personal history of other diseases of the circulatory system: Secondary | ICD-10-CM

## 2020-08-01 DIAGNOSIS — Z9889 Other specified postprocedural states: Secondary | ICD-10-CM

## 2020-08-01 DIAGNOSIS — I10 Essential (primary) hypertension: Secondary | ICD-10-CM

## 2020-08-01 DIAGNOSIS — E6609 Other obesity due to excess calories: Secondary | ICD-10-CM

## 2020-08-01 MED ORDER — AMLODIPINE BESYLATE 5 MG PO TABS: 5.0000 mg | ORAL_TABLET | Freq: Every evening | ORAL | 1 refills | Status: DC

## 2020-08-01 MED ORDER — CHLORTHALIDONE 25 MG PO TABS: 25.0000 mg | ORAL_TABLET | Freq: Every day | ORAL | 0 refills | Status: DC

## 2020-08-01 NOTE — Progress Notes (Signed)
Alexis Terry Date of Birth: [birth year] MRN: [id number] Primary Care Provider:Beal, Christa See Former Cardiology Providers: [place], APRN, FNP-C Primary Cardiologist: Tessa Lerner, DO, [facility] (established care 06/20/2020) Cardiothoracic Surgeon: Dr. Aurea Graff  Date: 08/01/2020 Last Office Visit: 06/20/2020  Chief Complaint  Patient presents with  . Hypertension  . Follow-up    HPI  Alexis Terry is a 49 y.o.  male who presents to the office with a chief complaint of " blood pressure follow-up." Patient's past medical history and cardiovascular risk factors include: History of type A aortic dissection status post replacement of the ascending aorta and hemiarch under [employer] with Dr. Ty Hilts at [facility], mixed hyperlipidemia, hypertension, obesity due to excess calories.  In the past, he was followed by Phineas Semen and reestablished care with myself back in August 2021.  In the past patient has noted that his blood pressures are labile and given his complex cardiac history with type a dissection in the past I have recommended stricter blood pressure management.    With the medication changes made at the last office visit patient states that his average blood pressure has now improved to around 134/79.  He does not feel tired or fatigued.  No pain between the shoulder blades.  No chest pain or shortness of breath at rest or with effort related activities.    No recent hospitalizations or urgent care visits for cardiovascular symptoms.  He has a follow-up appointment with his cardiothoracic surgeon December 2022 with a CT scan prior to that the follow-up on his history of ascending aortic dissection status post replacement of ascending aorta and hemiarch.  ALLERGIES: No Known Allergies   MEDICATION LIST PRIOR TO VISIT: Current Outpatient Medications on File Prior to Visit  Medication Sig Dispense Refill  . aspirin (ASPIRIN LOW DOSE) 81 MG EC tablet TAKE 1 TABLET(81 MG) BY MOUTH  DAILY 90 tablet 1  . atorvastatin (LIPITOR) 40 MG tablet Take 1 tablet (40 mg total) by mouth at bedtime. 90 tablet 1  . hydrALAZINE (APRESOLINE) 50 MG tablet TAKE 1 TABLET(50 MG) BY MOUTH THREE TIMES DAILY 90 tablet 0  . ibuprofen (ADVIL,MOTRIN) 200 MG tablet Take 4,000 mg by mouth every 6 (six) hours as needed for moderate pain.     Marland Kitchen losartan (COZAAR) 100 MG tablet Take 1 tablet (100 mg total) by mouth daily. 90 tablet 0  . metoprolol tartrate (LOPRESSOR) 100 MG tablet Take 1 tablet (100 mg total) by mouth 2 (two) times daily. 180 tablet 1   No current facility-administered medications on file prior to visit.    PAST MEDICAL HISTORY: Past Medical History:  Diagnosis Date  . Bilateral carotid bruits   . Hypertension   . S/P aortic dissection repair   . Stroke [facility]) 2011   patient's wife reports "small stroke involving his eye" about 8 years ago, no deficits noted    PAST SURGICAL HISTORY: Past Surgical History:  Procedure Laterality Date  . REPAIR OF ACUTE ASCENDING THORACIC AORTIC DISSECTION      FAMILY HISTORY: The patient's family history includes Hypertension in his father and mother.   SOCIAL HISTORY:  The patient  reports that he has never smoked. He has never used smokeless tobacco. He reports previous alcohol use. He reports that he does not use drugs.  Review of Systems  Constitutional: Negative for chills and fever.  HENT: Negative for hoarse voice and nosebleeds.   Eyes: Negative for discharge, double vision and pain.  Cardiovascular: Negative for chest  pain, claudication, dyspnea on exertion, leg swelling, near-syncope, orthopnea, palpitations, paroxysmal nocturnal dyspnea and syncope.  Respiratory: Negative for hemoptysis and shortness of breath.   Musculoskeletal: Negative for muscle cramps and myalgias.  Gastrointestinal: Negative for abdominal pain, constipation, diarrhea, hematemesis, hematochezia, melena, nausea and vomiting.  Neurological: Negative for  dizziness and light-headedness.    PHYSICAL EXAM: Vitals with BMI 08/01/2020 06/20/2020 06/20/2020  Height 6\' 1"  - 6\' 1"   Weight 244 lbs - 248 lbs  BMI 32.2 - 32.73  Systolic 137 145  Diastolic 76 80 78  Pulse 66 61 67   CONSTITUTIONAL: Well-developed and well-nourished. No acute distress.  SKIN: Skin is warm and dry. No rash noted. No cyanosis. No pallor. No jaundice HEAD: Normocephalic and atraumatic.  EYES: No scleral icterus MOUTH/THROAT: Moist oral membranes.  NECK: No JVD present. No thyromegaly noted. No carotid bruits  LYMPHATIC: No visible cervical adenopathy.  CHEST Normal respiratory effort. No intercostal retractions.  Sternotomy site is healed well. LUNGS: Clear to auscultation bilaterally.  No stridor. No wheezes. No rales.  CARDIOVASCULAR: Regular, positive S1-S2, no murmurs rubs or gallops appreciated. ABDOMINAL: No apparent ascites.  EXTREMITIES: No peripheral edema  HEMATOLOGIC: No significant bruising NEUROLOGIC: Oriented to person, place, and time. Nonfocal. Normal muscle tone.  PSYCHIATRIC: Normal mood and affect. Normal behavior. Cooperative  RADIOLOGY: CTA chest/abdomen/pelvis 09/02/2018: IMPRESSION: Acute Stanford type A thoracic aortic dissection starting from the aortic root which is aneurysmal in appearance up to 5.1 cm in caliber. Spiraling of the dissection flap is noted with involvement of dissection into the right brachiocephalic and left subclavian arteries.  Extension of dissection into the nonaneurysmal abdominal aorta, both common iliac and external iliac arteries as well as extension into the right internal iliac artery with occluded or near occluded appearance of the distal right common iliac artery and extension of the dissection into the left renal artery.  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  10/23/2019 CTA chest: Status post surgical Pera of ascending thoracic aortic dissection and aneurysm noted on prior  exam. Residual dissection remains which begins just proximal to the origin of the right innominate artery and extends through the transverse aortic arch and descending thoracic aorta into the abdominal aorta. Dissection flap is seen to extend into the proximal left subclavian artery which is unchanged compared to prior exam. Aortic root is mildly dilated at 4.3 cm. No other aneurysm is noted throughout the thoracic aorta.  CARDIAC DATABASE: EKG: 06/20/2020: Normal sinus rhythm, 66 bpm, incomplete right bundle branch block, left atrial enlargement, without underlying ischemia or injury pattern.  Echocardiogram: 10/23/2018: LVEF 62%, mild LVH, mildly dilated left atrium, mild AR, mild MR, mild TR, PA SP 33 mmHg, aortic root measured 3.8 cm status post ascending aorta and hemiarch replacement.  Carotid artery duplex 10/23/2018: Stenosis in the right external carotid artery (<50%). This is the source of bruit. Mild soft plaque noted in right carotid artery. Antegrade right vertebral artery flow. Antegrade left vertebral artery flow. Follow up studies is appropriate if clinically indicated.  LABORATORY DATA: CBC Latest Ref Rng & Units 09/29/2019 09/02/2018 06/17/2010  WBC 3.4 - 10.8 x10E3/uL 10.9(H) 23.0(H) 8.9  Hemoglobin 13.0 - 17.7 g/dL 09/04/2018 06/19/2010 19.1  Hematocrit 37.5 - 51.0 % 42.5 46.8 43.5  Platelets 150 - 450 x10E3/uL 283 275 210    CMP Latest Ref Rng & Units 08/09/2020 06/28/2020 09/29/2019  Glucose 65 - 99 mg/dL 78 08/28/2020) 13/08/2019)  BUN 6 - 24 mg/dL 621(H) 086(V) 19  Creatinine 0.76 - 1.27 mg/dL 1.61(W1.48(H) 9.601.15 4.541.12  Sodium 134 - 144 mmol/L 141 139 139  Potassium 3.5 - 5.2 mmol/L 4.7 4.4 5.0  Chloride 96 - 106 mmol/L 101 101 103  CO2 20 - 29 mmol/L 27 23 22   Calcium 8.7 - 10.2 mg/dL 09.810.0 9.6 11.910.2  Total Protein 6.0 - 8.5 g/dL - - 7.5  Total Bilirubin 0.0 - 1.2 mg/dL - - 0.6  Alkaline Phos 39 - 117 IU/L - - 125(H)  AST 0 - 40 IU/L - - 23  ALT 0 - 44 IU/L - - 21    Lipid Panel      Component Value Date/Time   CHOL 138 09/29/2019 1045   TRIG 137 09/29/2019 1045   HDL 41 09/29/2019 1045   CHOLHDL 3.4 09/29/2019 1045   CHOLHDL 4.3 06/17/2010 0545   VLDL 10 06/17/2010 0545   LDLCALC 73 09/29/2019 1045   LABVLDL 24 09/29/2019 1045    Lab Results  Component Value Date   HGBA1C (H) 06/17/2010    5.7 (NOTE)                                                                       According to the ADA Clinical Practice Recommendations for 2011, when HbA1c is used as a screening test:   >=6.5%   Diagnostic of Diabetes Mellitus           (if abnormal result  is confirmed)  5.7-6.4%   Increased risk of developing Diabetes Mellitus  References:Diagnosis and Classification of Diabetes Mellitus,Diabetes Care,2011,34(Suppl 1):S62-S69 and Standards of Medical Care in         Diabetes - 2011,Diabetes Care,2011,34  (Suppl 1):S11-S61.   No components found for: NTPROBNP No results found for: TSH  Cardiac Panel (last 3 results) No results for input(s): CKTOTAL, CKMB, TROPONINIHS, RELINDX in the last 72 hours.  IMPRESSION:    ICD-10-CM   1. Essential hypertension  I10 amLODipine (NORVASC) 5 MG tablet    chlorthalidone (HYGROTON) 25 MG tablet    Basic metabolic panel    Magnesium    Magnesium    Basic metabolic panel  2. History of aortic dissection  Z86.79   3. Hx of repair of dissecting thoracic aortic aneurysm, Stanford type A  Z98.890    Z86.79   4. Mixed hyperlipidemia  E78.2   5. Class 1 obesity due to excess calories with serious comorbidity and body mass index (BMI) of 32.0 to 32.9 in adult  E66.09    Z68.32      RECOMMENDATIONS: Alexis HidesRobert W Terry is a 49 y.o. male whose past medical history and cardiovascular risk factors include: History of type A aortic dissection status post replacement of the ascending aorta and hemiarch under [facility] [facility], mixed hyperlipidemia, hypertension, obesity due to excess calories.  Benign essential hypertension:  Improving.  Home blood pressures are improving but currently not at goal.    Given his history of type A aortic dissection I recommended the patient's systolic blood pressures be around 120 mmHg if able to tolerate.  Medications reconciled.  Increase chlorthalidone 25 mg p.o. every morning.  Repeat blood work in 1 week to evaluate kidney function and electrolytes.  Educated on importance  of low-salt diet.  Patient requesting a refill on Norvasc.      Mixed hyperlipidemia: Refilled Lipitor.  History of type A aortic dissection status post repair: Patient is encouraged to follow-up with his cardiothoracic surgeon as recommended.  FINAL MEDICATION LIST END OF ENCOUNTER: Meds ordered this encounter  Medications  . amLODipine (NORVASC) 5 MG tablet    Sig: Take 1 tablet (5 mg total) by mouth every evening.    Dispense:  90 tablet    Refill:  1  . chlorthalidone (HYGROTON) 25 MG tablet    Sig: Take 1 tablet (25 mg total) by mouth daily.    Dispense:  90 tablet    Refill:  0    Medications Discontinued During This Encounter  Medication Reason  . chlorthalidone (HYGROTON) 25 MG tablet Dose change  . amLODipine (NORVASC) 5 MG tablet Reorder     Current Outpatient Medications:  .  amLODipine (NORVASC) 5 MG tablet, Take 1 tablet (5 mg total) by mouth every evening., Disp: 90 tablet, Rfl: 1 .  aspirin (ASPIRIN LOW DOSE) 81 MG EC tablet, TAKE 1 TABLET(81 MG) BY MOUTH DAILY, Disp: 90 tablet, Rfl: 1 .  atorvastatin (LIPITOR) 40 MG tablet, Take 1 tablet (40 mg total) by mouth at bedtime., Disp: 90 tablet, Rfl: 1 .  hydrALAZINE (APRESOLINE) 50 MG tablet, TAKE 1 TABLET(50 MG) BY MOUTH THREE TIMES DAILY, Disp: 90 tablet, Rfl: 0 .  ibuprofen (ADVIL,MOTRIN) 200 MG tablet, Take 4,000 mg by mouth every 6 (six) hours as needed for moderate pain. , Disp: , Rfl:  .  losartan (COZAAR) 100 MG tablet, Take 1 tablet (100 mg total) by mouth daily., Disp: 90 tablet, Rfl: 0 .  metoprolol tartrate  (LOPRESSOR) 100 MG tablet, Take 1 tablet (100 mg total) by mouth 2 (two) times daily., Disp: 180 tablet, Rfl: 1 .  chlorthalidone (HYGROTON) 25 MG tablet, Take 1 tablet (25 mg total) by mouth daily., Disp: 90 tablet, Rfl: 0  Orders Placed This Encounter  Procedures  . Basic metabolic panel  . Magnesium   --Continue cardiac medications as reconciled in final medication list. --Return in about 6 months (around 01/29/2021) for Reevaluation of, BP. Or sooner if needed. --Continue follow-up with your primary care physician regarding the management of your other chronic comorbid conditions.  Patient's questions and concerns were addressed to his satisfaction. He voices understanding of the instructions provided during this encounter.   This note was created using a voice recognition software as a result there may be grammatical errors inadvertently enclosed that do not reflect the nature of this encounter. Every attempt is made to correct such errors.  Tessa Lerner, [place], [facility]  Pager: [phone] Office: [phone]

## 2020-08-09 DIAGNOSIS — I10 Essential (primary) hypertension: Secondary | ICD-10-CM | POA: Diagnosis not present

## 2020-08-10 ENCOUNTER — Other Ambulatory Visit: Payer: Self-pay

## 2020-08-10 ENCOUNTER — Other Ambulatory Visit: Payer: Self-pay | Admitting: Cardiology

## 2020-08-10 DIAGNOSIS — I712 Thoracic aortic aneurysm, without rupture, unspecified: Secondary | ICD-10-CM

## 2020-08-10 DIAGNOSIS — I10 Essential (primary) hypertension: Secondary | ICD-10-CM

## 2020-08-10 LAB — BASIC METABOLIC PANEL
BUN/Creatinine Ratio: 22 — ABNORMAL HIGH (ref 9–20)
BUN: 32 mg/dL — ABNORMAL HIGH (ref 6–24)
CO2: 27 mmol/L (ref 20–29)
Calcium: 10 mg/dL (ref 8.7–10.2)
Chloride: 101 mmol/L (ref 96–106)
Creatinine, Ser: 1.48 mg/dL — ABNORMAL HIGH (ref 0.76–1.27)
GFR calc Af Amer: 62 mL/min/{1.73_m2} (ref >59)
GFR calc non Af Amer: 54 mL/min/{1.73_m2} — ABNORMAL LOW (ref >59)
Glucose: 78 mg/dL (ref 65–99)
Potassium: 4.7 mmol/L (ref 3.5–5.2)
Sodium: 141 mmol/L (ref 134–144)

## 2020-08-10 LAB — MAGNESIUM: Magnesium: 1.9 mg/dL (ref 1.6–2.3)

## 2020-08-11 ENCOUNTER — Telehealth: Payer: Self-pay

## 2020-08-11 NOTE — Telephone Encounter (Signed)
Patient is aware .. per dr. [name] Please have him hold chlorthalidone as his serum creatinine level has increased.  1 week later please have him repeat bloodwork to recheck kidney function

## 2020-08-18 DIAGNOSIS — I712 Thoracic aortic aneurysm, without rupture: Secondary | ICD-10-CM | POA: Diagnosis not present

## 2020-08-18 DIAGNOSIS — I10 Essential (primary) hypertension: Secondary | ICD-10-CM | POA: Diagnosis not present

## 2020-08-19 LAB — BASIC METABOLIC PANEL
BUN/Creatinine Ratio: 16 (ref 9–20)
BUN: 17 mg/dL (ref 6–24)
CO2: 22 mmol/L (ref 20–29)
Calcium: 9.5 mg/dL (ref 8.7–10.2)
Chloride: 107 mmol/L — ABNORMAL HIGH (ref 96–106)
Creatinine, Ser: 1.06 mg/dL (ref 0.76–1.27)
GFR calc Af Amer: 93 mL/min/{1.73_m2} (ref >59)
GFR calc non Af Amer: 81 mL/min/{1.73_m2} (ref >59)
Glucose: 100 mg/dL — ABNORMAL HIGH (ref 65–99)
Potassium: 4.7 mmol/L (ref 3.5–5.2)
Sodium: 143 mmol/L (ref 134–144)

## 2020-08-19 LAB — MAGNESIUM: Magnesium: 1.8 mg/dL (ref 1.6–2.3)

## 2020-08-23 ENCOUNTER — Telehealth: Payer: Self-pay

## 2020-08-23 NOTE — Progress Notes (Signed)
Spoke to patient, patient is aware

## 2020-08-23 NOTE — Telephone Encounter (Signed)
-----   Message from [place], [place] sent at 08/22/2020 10:21 PM EDT ----- Please inform that the kidney function is back to baseline.Please start chlorthalidone at 12.5 mg p.o. every morning.After starting chlorthalidone if his blood pressures continue to be greater than 120 mmHg please have him call the office for further medication titration.

## 2020-08-23 NOTE — Telephone Encounter (Signed)
Attempted to contact patient x2 regarding chlorthalidone, however phone disconnects when patient attempts to answer. Will try again at later date.

## 2020-08-26 NOTE — Telephone Encounter (Signed)
Please reach out to him again.

## 2020-08-30 NOTE — Telephone Encounter (Signed)
Left vm to vb.

## 2020-08-31 NOTE — Telephone Encounter (Signed)
3rd attempt : Called patient, NA, LMAM

## 2020-10-10 DIAGNOSIS — M79662 Pain in left lower leg: Secondary | ICD-10-CM | POA: Diagnosis not present

## 2020-10-27 ENCOUNTER — Other Ambulatory Visit: Payer: Self-pay | Admitting: Cardiology

## 2020-10-27 DIAGNOSIS — I10 Essential (primary) hypertension: Secondary | ICD-10-CM

## 2020-11-06 ENCOUNTER — Other Ambulatory Visit: Payer: Self-pay | Admitting: Cardiology

## 2020-11-06 DIAGNOSIS — I10 Essential (primary) hypertension: Secondary | ICD-10-CM

## 2020-11-14 ENCOUNTER — Other Ambulatory Visit: Payer: Self-pay

## 2020-11-14 DIAGNOSIS — I10 Essential (primary) hypertension: Secondary | ICD-10-CM

## 2020-11-14 MED ORDER — HYDRALAZINE HCL 50 MG PO TABS: ORAL_TABLET | ORAL | 3 refills | Status: DC

## 2020-12-20 ENCOUNTER — Other Ambulatory Visit: Payer: Self-pay | Admitting: Cardiology

## 2020-12-20 DIAGNOSIS — I10 Essential (primary) hypertension: Secondary | ICD-10-CM

## 2020-12-21 ENCOUNTER — Other Ambulatory Visit: Payer: Self-pay | Admitting: Cardiology

## 2021-01-30 ENCOUNTER — Ambulatory Visit: Payer: BC Managed Care – PPO | Admitting: Cardiology

## 2021-01-30 ENCOUNTER — Other Ambulatory Visit: Payer: Self-pay

## 2021-01-30 ENCOUNTER — Encounter: Payer: Self-pay | Admitting: Cardiology

## 2021-01-30 VITALS — BP 132/72 | HR 65 | Temp 97.8°F | Resp 16 | Ht 73.0 in | Wt 246.0 lb

## 2021-01-30 DIAGNOSIS — E782 Mixed hyperlipidemia: Secondary | ICD-10-CM

## 2021-01-30 DIAGNOSIS — E6609 Other obesity due to excess calories: Secondary | ICD-10-CM

## 2021-01-30 DIAGNOSIS — R0989 Other specified symptoms and signs involving the circulatory and respiratory systems: Secondary | ICD-10-CM

## 2021-01-30 DIAGNOSIS — I10 Essential (primary) hypertension: Secondary | ICD-10-CM

## 2021-01-30 DIAGNOSIS — Z8679 Personal history of other diseases of the circulatory system: Secondary | ICD-10-CM

## 2021-01-30 DIAGNOSIS — Z9889 Other specified postprocedural states: Secondary | ICD-10-CM

## 2021-01-30 NOTE — Progress Notes (Signed)
Maye Hides Date of Birth: 03-03-1971 MRN: [id number] Primary Care Provider:Beal, Christa See Former Cardiology Providers: [place], APRN, FNP-C Primary Cardiologist: Tessa Lerner, DO, [facility] (established care 06/20/2020) Cardiothoracic Surgeon: Dr. Aurea Graff  Date: 01/30/21 Last Office Visit: 08/01/2020  Chief Complaint  Patient presents with   Hypertension   Follow-up    6 month    HPI  Alexis Terry is a 50 y.o.  male who presents to the office with a chief complaint of " blood pressure follow-up." Patient's past medical history and cardiovascular risk factors include: History of type A aortic dissection status post replacement of the ascending aorta and hemiarch under [employer] with Dr. Ty Hilts at [facility], mixed hyperlipidemia, hypertension, obesity due to excess calories.  Patient presents today for 45-month follow-up for blood pressure management given his history of aortic dissection as mentioned above.  Over the last 6 months has been doing well from a cardiovascular standpoint.  No hospitalizations or ER visits for cardiovascular symptoms.  He does keep a log of his blood pressures which were reviewed with him at today's office visit.  His systolic blood pressures are consistently less than 125 mmHg and diastolic blood pressures between 60-70 mmHg.  Pulse remains between 60-70 bpm.  He is tolerating the medications well without any side effects or intolerances.  At the last office visit we initiated chlorthalidone at 25 mg p.o. daily.  Repeat blood work noted elevated serum creatinine level and therefore was asked to decrease the chlorthalidone to 12.5 mg p.o. daily.  Repeat blood work was within acceptable limits.  He has a follow-up appointment with his cardiothoracic surgeon December 2022 with a CT scan prior to that the follow-up on his history of ascending aortic dissection status post replacement of ascending aorta and hemiarch.  Patient remains active for his  age.  He rollerblades over the weekends for several hours at a stretch and plans to start riding his bike once the weather improves.  ALLERGIES: No Known Allergies   MEDICATION LIST PRIOR TO VISIT: Current Outpatient Medications on File Prior to Visit  Medication Sig Dispense Refill   amLODipine (NORVASC) 5 MG tablet TAKE 1 TABLET(5 MG) BY MOUTH EVERY EVENING 90 tablet 1   aspirin (ASPIRIN LOW DOSE) 81 MG EC tablet TAKE 1 TABLET(81 MG) BY MOUTH DAILY 90 tablet 1   atorvastatin (LIPITOR) 40 MG tablet TAKE 1 TABLET(40 MG) BY MOUTH AT BEDTIME 90 tablet 1   chlorthalidone (HYGROTON) 25 MG tablet TAKE 1 TABLET(25 MG) BY MOUTH DAILY (Patient taking differently: 12.5 mg every morning.) 90 tablet 1   hydrALAZINE (APRESOLINE) 50 MG tablet TAKE 1 TABLET(50 MG) BY MOUTH THREE TIMES DAILY 90 tablet 3   ibuprofen (ADVIL,MOTRIN) 200 MG tablet Take 4,000 mg by mouth every 6 (six) hours as needed for moderate pain.      losartan (COZAAR) 100 MG tablet TAKE 1 TABLET(100 MG) BY MOUTH TWICE DAILY 180 tablet 0   metoprolol tartrate (LOPRESSOR) 100 MG tablet TAKE 1 TABLET(100 MG) BY MOUTH TWICE DAILY 180 tablet 1   No current facility-administered medications on file prior to visit.    PAST MEDICAL HISTORY: Past Medical History:  Diagnosis Date   Bilateral carotid bruits    Hypertension    S/P aortic dissection repair    Stroke [facility]) 2011   patient's wife reports "small stroke involving his eye" about 8 years ago, no deficits noted    PAST SURGICAL HISTORY: Past Surgical History:  Procedure Laterality Date  REPAIR OF ACUTE ASCENDING THORACIC AORTIC DISSECTION      FAMILY HISTORY: The patient's family history includes Hypertension in his father and mother.   SOCIAL HISTORY:  The patient  reports that he has never smoked. He has never used smokeless tobacco. He reports previous alcohol use. He reports that he does not use drugs.  Review of Systems  Constitutional: Negative for  chills and fever.  HENT: Negative for hoarse voice and nosebleeds.   Eyes: Negative for discharge, double vision and pain.  Cardiovascular: Negative for chest pain, claudication, dyspnea on exertion, leg swelling, near-syncope, orthopnea, palpitations, paroxysmal nocturnal dyspnea and syncope.  Respiratory: Negative for hemoptysis and shortness of breath.   Musculoskeletal: Negative for muscle cramps and myalgias.  Gastrointestinal: Negative for abdominal pain, constipation, diarrhea, hematemesis, hematochezia, melena, nausea and vomiting.  Neurological: Negative for dizziness and light-headedness.    PHYSICAL EXAM: Vitals with BMI 01/30/2021 01/30/2021 08/01/2020  Height - 6\' 1"  6\' 1"   Weight - 246 lbs 244 lbs  BMI - 32.46 32.2  Systolic 132 162 [id number]  Diastolic 72 81 76  Pulse 65 66 66   CONSTITUTIONAL: Well-developed and well-nourished. No acute distress.  SKIN: Skin is warm and dry. No rash noted. No cyanosis. No pallor. No jaundice HEAD: Normocephalic and atraumatic.  EYES: No scleral icterus MOUTH/THROAT: Moist oral membranes.  NECK: No JVD present. No thyromegaly noted. Bilateral carotid bruits  LYMPHATIC: No visible cervical adenopathy.  CHEST Normal respiratory effort. No intercostal retractions.  Sternotomy site is healed well. LUNGS: Clear to auscultation bilaterally.  No stridor. No wheezes. No rales.  CARDIOVASCULAR: Regular, positive S1-S2, no murmurs rubs or gallops appreciated. ABDOMINAL: No apparent ascites.  EXTREMITIES: No peripheral edema  HEMATOLOGIC: No significant bruising NEUROLOGIC: Oriented to person, place, and time. Nonfocal. Normal muscle tone.  PSYCHIATRIC: Normal mood and affect. Normal behavior. Cooperative  RADIOLOGY: CTA chest/abdomen/pelvis 09/02/2018: IMPRESSION: Acute Stanford type A thoracic aortic dissection starting from the aortic root which is aneurysmal in appearance up to 5.1 cm in caliber. Spiraling of the dissection flap is noted with  involvement of dissection into the right brachiocephalic and left subclavian arteries.  Extension of dissection into the nonaneurysmal abdominal aorta, both common iliac and external iliac arteries as well as extension into the right internal iliac artery with occluded or near occluded appearance of the distal right common iliac artery and extension of the dissection into the left renal artery.  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  10/23/2019 CTA chest: Status post surgical Pera of ascending thoracic aortic dissection and aneurysm noted on prior exam. Residual dissection remains which begins just proximal to the origin of the right innominate artery and extends through the transverse aortic arch and descending thoracic aorta into the abdominal aorta. Dissection flap is seen to extend into the proximal left subclavian artery which is unchanged compared to prior exam. Aortic root is mildly dilated at 4.3 cm. No other aneurysm is noted throughout the thoracic aorta.  CARDIAC DATABASE: EKG: 06/20/2020: Normal sinus rhythm, 66 bpm, incomplete right bundle branch block, left atrial enlargement, without underlying ischemia or injury pattern.  Echocardiogram: 10/23/2018: LVEF 62%, mild LVH, mildly dilated left atrium, mild AR, mild MR, mild TR, PA SP 33 mmHg, aortic root measured 3.8 cm status post ascending aorta and hemiarch replacement.  Carotid artery duplex 10/23/2018: Stenosis in the right external carotid artery (<50%). This is the source of bruit. Mild soft plaque noted in right carotid artery. Antegrade right vertebral artery flow.  Antegrade left vertebral artery flow. Follow up studies is appropriate if clinically indicated.  LABORATORY DATA: CBC Latest Ref Rng & Units 09/29/2019 09/02/2018 06/17/2010  WBC 3.4 - 10.8 x10E3/uL 10.9(H) 23.0(H) 8.9  Hemoglobin 13.0 - 17.7 g/dL 20.9 47.0 96.2  Hematocrit 37.5 - 51.0 % 42.5 46.8 43.5  Platelets 150 - 450 x10E3/uL  283 275 210    CMP Latest Ref Rng & Units 08/18/2020 08/09/2020 06/28/2020  Glucose 65 - 99 mg/dL 836(O) 78 294(T)  BUN 6 - 24 mg/dL 17 65(Y) 65(K)  Creatinine 0.76 - 1.27 mg/dL 3.54 6.56(C) 1.27  Sodium 134 - 144 mmol/L 143 141 139  Potassium 3.5 - 5.2 mmol/L 4.7 4.7 4.4  Chloride 96 - 106 mmol/L 107(H) 101 101  CO2 20 - 29 mmol/L 22 27 23   Calcium 8.7 - 10.2 mg/dL 9.5 9.6  Total Protein 6.0 - 8.5 g/dL - - -  Total Bilirubin 0.0 - 1.2 mg/dL - - -  Alkaline Phos 39 - 117 IU/L - - -  AST 0 - 40 IU/L - - -  ALT 0 - 44 IU/L - - -    Lipid Panel  Lab Results  Component Value Date   CHOL 138 09/29/2019   HDL 41 09/29/2019   LDLCALC 73 09/29/2019   TRIG 137 09/29/2019   CHOLHDL 3.4 09/29/2019    Lab Results  Component Value Date   HGBA1C (H) 06/17/2010    5.7 (NOTE)                                                                       According to the ADA Clinical Practice Recommendations for 2011, when HbA1c is used as a screening test:   >=6.5%   Diagnostic of Diabetes Mellitus           (if abnormal result  is confirmed)  5.7-6.4%   Increased risk of developing Diabetes Mellitus  References:Diagnosis and Classification of Diabetes Mellitus,Diabetes Care,2011,34(Suppl 1):S62-S69 and Standards of Medical Care in         Diabetes - 2011,Diabetes Care,2011,34  (Suppl 1):S11-S61.   No components found for: NTPROBNP No results found for: TSH  Cardiac Panel (last 3 results) No results for input(s): CKTOTAL, CKMB, TROPONINIHS, RELINDX in the last 72 hours.  IMPRESSION:    ICD-10-CM   1. Essential hypertension  I10 EKG 12-Lead  2. History of aortic dissection  Z86.79      RECOMMENDATIONS: Alexis Terry is a 51 y.o. male whose past medical history and cardiovascular risk factors include: History of type A aortic dissection status post replacement of the ascending aorta and hemiarch under [employer] with Dr. 44 at [facility], mixed hyperlipidemia, hypertension, obesity due to  excess calories.  Benign essential hypertension: Improving. Office blood pressure is within acceptable range. Home blood pressures currently at goal. Medications reconciled. Low-salt diet recommended. Encouraged 30 minutes of moderate intensity exercise 5 days a week as tolerated.  Mixed hyperlipidemia:  Currently on statin therapy. Does not endorse myalgias. Recommended that he gets a repeat fasting lipid profile with his yearly physical.  And to either bring it with him at the next office visit or have the results faxed over for reference.  Bilateral carotid bruits: We will check  a carotid duplex to evaluate for carotid artery atherosclerosis.   The last study was done in 2019 as noted above. Given his history he is already on aspirin and statin therapy.  History of type A aortic dissection status post repair: Patient is encouraged to follow-up with his cardiothoracic surgeon as recommended.  Given his cardiovascular risk factors recommended a GXT prior to the next office visit to evaluate for exercise-induced ischemia.  FINAL MEDICATION LIST END OF ENCOUNTER: No orders of the defined types were placed in this encounter.   There are no discontinued medications.   Current Outpatient Medications:    amLODipine (NORVASC) 5 MG tablet, TAKE 1 TABLET(5 MG) BY MOUTH EVERY EVENING, Disp: 90 tablet, Rfl: 1   aspirin (ASPIRIN LOW DOSE) 81 MG EC tablet, TAKE 1 TABLET(81 MG) BY MOUTH DAILY, Disp: 90 tablet, Rfl: 1   atorvastatin (LIPITOR) 40 MG tablet, TAKE 1 TABLET(40 MG) BY MOUTH AT BEDTIME, Disp: 90 tablet, Rfl: 1   chlorthalidone (HYGROTON) 25 MG tablet, TAKE 1 TABLET(25 MG) BY MOUTH DAILY (Patient taking differently: 12.5 mg every morning.), Disp: 90 tablet, Rfl: 1   hydrALAZINE (APRESOLINE) 50 MG tablet, TAKE 1 TABLET(50 MG) BY MOUTH THREE TIMES DAILY, Disp: 90 tablet, Rfl: 3   ibuprofen (ADVIL,MOTRIN) 200 MG tablet, Take 4,000 mg by mouth every 6 (six) hours as needed for  moderate pain. , Disp: , Rfl:    losartan (COZAAR) 100 MG tablet, TAKE 1 TABLET(100 MG) BY MOUTH TWICE DAILY, Disp: 180 tablet, Rfl: 0   metoprolol tartrate (LOPRESSOR) 100 MG tablet, TAKE 1 TABLET(100 MG) BY MOUTH TWICE DAILY, Disp: 180 tablet, Rfl: 1  Orders Placed This Encounter  Procedures   EKG 12-Lead   --Continue cardiac medications as reconciled in final medication list. --No follow-ups on file. Or sooner if needed. --Continue follow-up with your primary care physician regarding the management of your other chronic comorbid conditions.  Patient's questions and concerns were addressed to his satisfaction. He voices understanding of the instructions provided during this encounter.   This note was created using a voice recognition software as a result there may be grammatical errors inadvertently enclosed that do not reflect the nature of this encounter. Every attempt is made to correct such errors.  Tessa Lerner, [place], [facility]  Pager: [phone] Office: [phone]

## 2021-02-14 ENCOUNTER — Other Ambulatory Visit: Payer: Self-pay

## 2021-02-14 ENCOUNTER — Ambulatory Visit: Payer: Self-pay

## 2021-02-14 DIAGNOSIS — R0989 Other specified symptoms and signs involving the circulatory and respiratory systems: Secondary | ICD-10-CM

## 2021-02-27 NOTE — Progress Notes (Signed)
Called and spoke with patient regarding his CAD results.

## 2021-04-20 ENCOUNTER — Other Ambulatory Visit: Payer: Self-pay | Admitting: Cardiology

## 2021-04-20 DIAGNOSIS — I10 Essential (primary) hypertension: Secondary | ICD-10-CM

## 2021-06-14 ENCOUNTER — Other Ambulatory Visit: Payer: Self-pay | Admitting: Cardiology

## 2021-06-14 DIAGNOSIS — I10 Essential (primary) hypertension: Secondary | ICD-10-CM

## 2021-06-22 ENCOUNTER — Other Ambulatory Visit: Payer: Self-pay | Admitting: Cardiology

## 2021-06-29 ENCOUNTER — Other Ambulatory Visit: Payer: Self-pay | Admitting: Cardiology

## 2021-06-29 DIAGNOSIS — I10 Essential (primary) hypertension: Secondary | ICD-10-CM

## 2021-07-25 ENCOUNTER — Other Ambulatory Visit: Payer: Self-pay

## 2021-07-25 ENCOUNTER — Ambulatory Visit: Payer: Self-pay

## 2021-07-25 DIAGNOSIS — I10 Essential (primary) hypertension: Secondary | ICD-10-CM

## 2021-07-25 LAB — PCV CARDIAC STRESS TEST
Angina Index: 0
ST Depression (mm): 0 mm

## 2021-07-28 NOTE — Progress Notes (Signed)
Called and spoke with patient regarding her stress test results.

## 2021-07-31 ENCOUNTER — Ambulatory Visit: Payer: Self-pay | Admitting: Cardiology

## 2021-07-31 ENCOUNTER — Telehealth: Payer: Self-pay

## 2021-07-31 ENCOUNTER — Encounter: Payer: Self-pay | Admitting: Cardiology

## 2021-07-31 ENCOUNTER — Other Ambulatory Visit: Payer: Self-pay

## 2021-07-31 VITALS — BP 136/77 | HR 73 | Temp 98.5°F | Resp 17 | Ht 73.0 in | Wt 245.6 lb

## 2021-07-31 DIAGNOSIS — Z9889 Other specified postprocedural states: Secondary | ICD-10-CM

## 2021-07-31 DIAGNOSIS — Z8679 Personal history of other diseases of the circulatory system: Secondary | ICD-10-CM

## 2021-07-31 DIAGNOSIS — E782 Mixed hyperlipidemia: Secondary | ICD-10-CM

## 2021-07-31 DIAGNOSIS — E6609 Other obesity due to excess calories: Secondary | ICD-10-CM

## 2021-07-31 DIAGNOSIS — I10 Essential (primary) hypertension: Secondary | ICD-10-CM

## 2021-07-31 MED ORDER — CHLORTHALIDONE 25 MG PO TABS
12.5000 mg | ORAL_TABLET | Freq: Every day | ORAL | 1 refills | Status: DC
Start: 2021-07-31 — End: 2022-09-11

## 2021-07-31 MED ORDER — ATORVASTATIN CALCIUM 40 MG PO TABS: ORAL_TABLET | ORAL | 1 refills | Status: DC

## 2021-07-31 MED ORDER — AMLODIPINE BESYLATE 5 MG PO TABS: ORAL_TABLET | ORAL | 1 refills | Status: DC

## 2021-07-31 MED ORDER — LOSARTAN POTASSIUM 100 MG PO TABS: 100.0000 mg | ORAL_TABLET | Freq: Every morning | ORAL | 1 refills | Status: DC

## 2021-07-31 NOTE — Progress Notes (Signed)
Maye Hides Date of Birth: Jun 26, 1971 MRN: [id number] Primary Care Provider:Beal, Christa See Former Cardiology Providers: Alt[place], APRN, FNP-C Primary Cardiologist: Tessa Lerner, DO, [facility] (established care 06/20/2020) Cardiothoracic Surgeon: Dr. Aurea Graff  Date: 07/31/21 Last Office Visit: 01/30/2021  Chief Complaint  Patient presents with   Follow-up    6 month   Hypertension    HPI  Alexis Terry is a 50 y.o.  male who presents to the office with a chief complaint of " 1-month follow-up for blood pressure management." Patient's past medical history and cardiovascular risk factors include: History of type A aortic dissection status post replacement of the ascending aorta and hemiarch under [employer] with Dr. Ty Hilts at [facility], mixed hyperlipidemia, hypertension, obesity due to excess calories.  He presents for 56-month follow-up for blood pressure management given his history of aortic dissection status postsurgical repair.  Patient is SBP ranges between 110-121 and DBP ranges between 70-82.  Medications reconciled.  Patient states that he makes a strong effort on consuming a low-salt diet.  In the past his chlorthalidone dose was reduced to 12.5 mg p.o. daily due to elevation in serum creatinine.  He is also noted to have bilateral carotid bruits and subsequently underwent carotid duplex which showed no significant carotid artery atherosclerosis/stenosis.  He also underwent a GXT which was overall low risk study with good functional capacity.  Patient has an appointment to see his cardiothoracic surgeon December 2022 at [facility].  From a cardiovascular standpoint he denies any chest pain or shortness of breath at rest or with effort related activities.  No hospitalizations or urgent care visits since last office encounter.  ALLERGIES: No Known Allergies   MEDICATION LIST PRIOR TO VISIT: Current Outpatient Medications on File Prior to Visit  Medication Sig  Dispense Refill   aspirin (ASPIRIN LOW DOSE) 81 MG EC tablet TAKE 1 TABLET(81 MG) BY MOUTH DAILY 90 tablet 1   hydrALAZINE (APRESOLINE) 50 MG tablet TAKE 1 TABLET(50 MG) BY MOUTH THREE TIMES DAILY 90 tablet 3   ibuprofen (ADVIL,MOTRIN) 200 MG tablet Take 4,000 mg by mouth every 6 (six) hours as needed for moderate pain.      metoprolol tartrate (LOPRESSOR) 100 MG tablet TAKE 1 TABLET(100 MG) BY MOUTH TWICE DAILY 180 tablet 1   No current facility-administered medications on file prior to visit.    PAST MEDICAL HISTORY: Past Medical History:  Diagnosis Date   Bilateral carotid bruits    Hypertension    S/P aortic dissection repair    Stroke [facility]) 2011   patient's wife reports "small stroke involving his eye" about 8 years ago, no deficits noted    PAST SURGICAL HISTORY: Past Surgical History:  Procedure Laterality Date   REPAIR OF ACUTE ASCENDING THORACIC AORTIC DISSECTION      FAMILY HISTORY: The patient's family history includes Hypertension in his father and mother.   SOCIAL HISTORY:  The patient  reports that he has never smoked. He has never used smokeless tobacco. He reports that he does not currently use alcohol. He reports that he does not use drugs.  Review of Systems  Constitutional: Negative for chills and fever.  HENT:  Negative for hoarse voice and nosebleeds.   Eyes:  Negative for discharge, double vision and pain.  Cardiovascular:  Negative for chest pain, claudication, dyspnea on exertion, leg swelling, near-syncope, orthopnea, palpitations, paroxysmal nocturnal dyspnea and syncope.  Respiratory:  Negative for hemoptysis and shortness of breath.   Musculoskeletal:  Negative for  muscle cramps and myalgias.  Gastrointestinal:  Negative for abdominal pain, constipation, diarrhea, hematemesis, hematochezia, melena, nausea and vomiting.  Neurological:  Negative for dizziness and light-headedness.   PHYSICAL EXAM: Vitals with BMI 07/31/2021 07/31/2021 01/30/2021   Height - 6\' 1"  -  Weight - 245 lbs 10 oz -  BMI - 32.41 -  Systolic 136 146  Diastolic 77 77 72  Pulse 73 71 65   CONSTITUTIONAL: Well-developed and well-nourished. No acute distress.  SKIN: Skin is warm and dry. No rash noted. No cyanosis. No pallor. No jaundice HEAD: Normocephalic and atraumatic.  EYES: No scleral icterus MOUTH/THROAT: Moist oral membranes.  NECK: No JVD present. No thyromegaly noted. Bilateral carotid bruits  LYMPHATIC: No visible cervical adenopathy.  CHEST Normal respiratory effort. No intercostal retractions.  Sternotomy site is healed well. LUNGS: Clear to auscultation bilaterally.  No stridor. No wheezes. No rales.  CARDIOVASCULAR: Regular, positive S1-S2, no murmurs rubs or gallops appreciated. ABDOMINAL: No apparent ascites.  EXTREMITIES: No peripheral edema  HEMATOLOGIC: No significant bruising NEUROLOGIC: Oriented to person, place, and time. Nonfocal. Normal muscle tone.  PSYCHIATRIC: Normal mood and affect. Normal behavior. Cooperative  RADIOLOGY: CTA chest/abdomen/pelvis 09/02/2018: IMPRESSION: Acute Stanford type A thoracic aortic dissection starting from the aortic root which is aneurysmal in appearance up to 5.1 cm in caliber. Spiraling of the dissection flap is noted with involvement of dissection into the right brachiocephalic and left subclavian arteries.   Extension of dissection into the nonaneurysmal abdominal aorta, both common iliac and external iliac arteries as well as extension into the right internal iliac artery with occluded or near occluded appearance of the distal right common iliac artery and extension of the dissection into the left renal artery.   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  10/23/2019 CTA chest: Status post surgical Pera of ascending thoracic aortic dissection and aneurysm noted on prior exam. Residual dissection remains which begins just proximal to the origin of the right innominate  artery and extends through the transverse aortic arch and descending thoracic aorta into the abdominal aorta. Dissection flap is seen to extend into the proximal left subclavian artery which is unchanged compared to prior exam. Aortic root is mildly dilated at 4.3 cm. No other aneurysm is noted throughout the thoracic aorta.  CARDIAC DATABASE: EKG: 07/31/2021: Normal sinus rhythm, 69 bpm, LAE, incomplete right bundle branch block, LVH per voltage criteria, without underlying injury pattern.   Echocardiogram: 10/23/2018: LVEF 62%, mild LVH, mildly dilated left atrium, mild AR, mild MR, mild TR, PA SP 33 mmHg, aortic root measured 3.8 cm status post ascending aorta and hemiarch replacement.  Exercise treadmill stress test 07/25/2021: Exercise treadmill stress test performed using Bruce protocol.  Patient reached 9.4 METS, and 90% of age predicted maximum heart rate.  Exercise capacity was good.  No chest pain reported.  Normal heart rate and hemodynamic response. Stress EKG revealed no ischemic changes. Low risk study.  Carotid artery duplex 02/14/2021  No hemodynamically significant arterial disease in the internal carotid artery bilaterally.  Antegrade right vertebral artery flow. Antegrade left vertebral artery flow.  Follow up studies if clinically indicated.  Compared to prior study dated 10/23/2018: No significant change.   LABORATORY DATA: CBC Latest Ref Rng & Units 09/29/2019 09/02/2018 06/17/2010  WBC 3.4 - 10.8 x10E3/uL 10.9(H) 23.0(H) 8.9  Hemoglobin 13.0 - 17.7 g/dL 06/19/2010 29.5 62.1  Hematocrit 37.5 - 51.0 % 42.5 46.8 43.5  Platelets 150 - 450 x10E3/uL 283 275 210  CMP Latest Ref Rng & Units 08/18/2020 08/09/2020 06/28/2020  Glucose 65 - 99 mg/dL 976(B) 78 341(P)  BUN 6 - 24 mg/dL 17 37(T) 02(I)  Creatinine 0.76 - 1.27 mg/dL 0.97 3.53(G) 9.92  Sodium 134 - 144 mmol/L 143 141 139  Potassium 3.5 - 5.2 mmol/L 4.7 4.7 4.4  Chloride 96 - 106 mmol/L 107(H) 101 101  CO2 20 - 29 mmol/L  22 27 23   Calcium 8.7 - 10.2 mg/dL 9.5 9.6  Total Protein 6.0 - 8.5 g/dL - - -  Total Bilirubin 0.0 - 1.2 mg/dL - - -  Alkaline Phos 39 - 117 IU/L - - -  AST 0 - 40 IU/L - - -  ALT 0 - 44 IU/L - - -    Lipid Panel  Lab Results  Component Value Date   CHOL 138 09/29/2019   HDL 41 09/29/2019   LDLCALC 73 09/29/2019   TRIG 137 09/29/2019   CHOLHDL 3.4 09/29/2019    Lab Results  Component Value Date   HGBA1C (H) 06/17/2010    5.7 (NOTE)                                                                       According to the ADA Clinical Practice Recommendations for 2011, when HbA1c is used as a screening test:   >=6.5%   Diagnostic of Diabetes Mellitus           (if abnormal result  is confirmed)  5.7-6.4%   Increased risk of developing Diabetes Mellitus  References:Diagnosis and Classification of Diabetes Mellitus,Diabetes Care,2011,34(Suppl 1):S62-S69 and Standards of Medical Care in         Diabetes - 2011,Diabetes Care,2011,34  (Suppl 1):S11-S61.   No components found for: NTPROBNP No results found for: TSH  Cardiac Panel (last 3 results) No results for input(s): CKTOTAL, CKMB, TROPONINIHS, RELINDX in the last 72 hours.  IMPRESSION:    ICD-10-CM   1. Essential hypertension  I10 EKG 12-Lead    amLODipine (NORVASC) 5 MG tablet    chlorthalidone (HYGROTON) 25 MG tablet    losartan (COZAAR) 100 MG tablet    2. History of aortic dissection  Z86.79     3. Hx of repair of dissecting thoracic aortic aneurysm, Stanford type A  Z98.890    Z86.79     4. Mixed hyperlipidemia  E78.2 atorvastatin (LIPITOR) 40 MG tablet    5. Class 1 obesity due to excess calories with serious comorbidity and body mass index (BMI) of 32.0 to 32.9 in adult  E66.09    Z68.32        RECOMMENDATIONS: Alexis Terry is a 50 y.o. male whose past medical history and cardiovascular risk factors include: History of type A aortic dissection status post replacement of the ascending aorta and hemiarch  under [employer] with Dr. 44 at [facility], mixed hyperlipidemia, hypertension, obesity due to excess calories.  Essential hypertension Home and office blood pressures are very well controlled. Medications refilled. When he is due for refill we will consider transitioning him from Lopressor to Toprol-XL. Low-salt diet recommended Continue 30 minutes of exercise 5 days a week as tolerated.  History of aortic dissection & Hx of repair of dissecting thoracic aortic aneurysm, Stanford  type A: Patient follows up 6 months for blood pressure management given his prior history of aortic dissection status post repair. Has an upcoming office visit with his surgeon in December 2022 with a CT scan prior to the visit. I have asked him to have a copy of the office note sent to [place] for reference and review.  Mixed hyperlipidemia Continue statin therapy.   Follow lipids -will have a yearly physical in February 2023 Currently managed by primary care provider. Patient denies myalgia or other side effects.  Class 1 obesity due to excess calories with serious comorbidity and body mass index (BMI) of 32.0 to 32.9 in adult Body mass index is 32.4 kg/m. I reviewed with the patient the importance of diet, regular physical activity/exercise, weight loss.   Patient is educated on increasing physical activity gradually as tolerated.  With the goal of moderate intensity exercise for 30 minutes a day 5 days a week.  FINAL MEDICATION LIST END OF ENCOUNTER: Meds ordered this encounter  Medications   amLODipine (NORVASC) 5 MG tablet    Sig: TAKE 1 TABLET(5 MG) BY MOUTH EVERY EVENING    Dispense:  180 tablet    Refill:  1   atorvastatin (LIPITOR) 40 MG tablet    Sig: TAKE 1 TABLET(40 MG) BY MOUTH AT BEDTIME    Dispense:  90 tablet    Refill:  1   chlorthalidone (HYGROTON) 25 MG tablet    Sig: Take 0.5 tablets (12.5 mg total) by mouth daily.    Dispense:  45 tablet    Refill:  1   losartan (COZAAR) 100 MG  tablet    Sig: Take 1 tablet (100 mg total) by mouth every morning.    Dispense:  90 tablet    Refill:  1     Medications Discontinued During This Encounter  Medication Reason   amLODipine (NORVASC) 5 MG tablet Reorder   chlorthalidone (HYGROTON) 25 MG tablet Reorder   losartan (COZAAR) 100 MG tablet Reorder   atorvastatin (LIPITOR) 40 MG tablet Reorder     Current Outpatient Medications:    aspirin (ASPIRIN LOW DOSE) 81 MG EC tablet, TAKE 1 TABLET(81 MG) BY MOUTH DAILY, Disp: 90 tablet, Rfl: 1   hydrALAZINE (APRESOLINE) 50 MG tablet, TAKE 1 TABLET(50 MG) BY MOUTH THREE TIMES DAILY, Disp: 90 tablet, Rfl: 3   ibuprofen (ADVIL,MOTRIN) 200 MG tablet, Take 4,000 mg by mouth every 6 (six) hours as needed for moderate pain. , Disp: , Rfl:    metoprolol tartrate (LOPRESSOR) 100 MG tablet, TAKE 1 TABLET(100 MG) BY MOUTH TWICE DAILY, Disp: 180 tablet, Rfl: 1   amLODipine (NORVASC) 5 MG tablet, TAKE 1 TABLET(5 MG) BY MOUTH EVERY EVENING, Disp: 180 tablet, Rfl: 1   atorvastatin (LIPITOR) 40 MG tablet, TAKE 1 TABLET(40 MG) BY MOUTH AT BEDTIME, Disp: 90 tablet, Rfl: 1   chlorthalidone (HYGROTON) 25 MG tablet, Take 0.5 tablets (12.5 mg total) by mouth daily., Disp: 45 tablet, Rfl: 1   losartan (COZAAR) 100 MG tablet, Take 1 tablet (100 mg total) by mouth every morning., Disp: 90 tablet, Rfl: 1  Orders Placed This Encounter  Procedures   EKG 12-Lead   --Continue cardiac medications as reconciled in final medication list. --Return in about 6 months (around 01/28/2022) for Follow up, BP. Or sooner if needed. --Continue follow-up with your primary care physician regarding the management of your other chronic comorbid conditions.  Patient's questions and concerns were addressed to his satisfaction. He voices understanding of the instructions  provided during this encounter.   This note was created using a voice recognition software as a result there may be grammatical errors inadvertently enclosed that  do not reflect the nature of this encounter. Every attempt is made to correct such errors.  Tessa LernerSunit Mikenna Bunkley, [place]DO, Weisman Childrens Rehabilitation HospitalFACC  Pager: [phone] Office: [phone]

## 2021-07-31 NOTE — Telephone Encounter (Signed)
FYI, Patient is going to double up on hid metoprolol until he runs out and then we can send him 200 mg tab

## 2021-08-04 NOTE — Telephone Encounter (Signed)
Currently taking Lopressor 100mg  po bid. When he is due for refill send in script for Toprol XL 200mg  po qday.

## 2021-09-19 ENCOUNTER — Telehealth: Payer: Self-pay

## 2021-09-19 NOTE — Telephone Encounter (Signed)
Pt came in and stated that you had discussed with him changing his metoprolol to 200 mg once daily. Pt is currently taking Metoprolol 100 mg twice daily. He would like to know if we can change that now. Can we send this in?

## 2021-09-22 ENCOUNTER — Other Ambulatory Visit: Payer: Self-pay

## 2021-09-22 MED ORDER — METOPROLOL SUCCINATE ER 200 MG PO TB24: 200.0000 mg | ORAL_TABLET | Freq: Every day | ORAL | 0 refills | Status: DC

## 2021-09-22 NOTE — Telephone Encounter (Signed)
MAR has been updated and rx has been sent

## 2021-09-22 NOTE — Telephone Encounter (Signed)
Please update the MAR and send in the new script.

## 2021-09-25 ENCOUNTER — Other Ambulatory Visit: Payer: Self-pay | Admitting: Cardiology

## 2021-09-25 DIAGNOSIS — I10 Essential (primary) hypertension: Secondary | ICD-10-CM

## 2021-10-02 ENCOUNTER — Other Ambulatory Visit: Payer: Self-pay | Admitting: Cardiology

## 2021-12-24 ENCOUNTER — Other Ambulatory Visit: Payer: Self-pay | Admitting: Cardiology

## 2021-12-24 DIAGNOSIS — E782 Mixed hyperlipidemia: Secondary | ICD-10-CM

## 2022-01-16 ENCOUNTER — Other Ambulatory Visit: Payer: Self-pay

## 2022-01-16 DIAGNOSIS — I10 Essential (primary) hypertension: Secondary | ICD-10-CM

## 2022-01-29 ENCOUNTER — Encounter: Payer: Self-pay | Admitting: Cardiology

## 2022-01-29 ENCOUNTER — Other Ambulatory Visit: Payer: Self-pay

## 2022-01-29 ENCOUNTER — Ambulatory Visit: Payer: BC Managed Care – PPO | Admitting: Cardiology

## 2022-01-29 VITALS — BP 138/80 | HR 71 | Temp 97.8°F | Resp 16 | Ht 73.0 in | Wt 250.0 lb

## 2022-01-29 DIAGNOSIS — I10 Essential (primary) hypertension: Secondary | ICD-10-CM

## 2022-01-29 DIAGNOSIS — Z8679 Personal history of other diseases of the circulatory system: Secondary | ICD-10-CM

## 2022-01-29 DIAGNOSIS — E6609 Other obesity due to excess calories: Secondary | ICD-10-CM

## 2022-01-29 DIAGNOSIS — E782 Mixed hyperlipidemia: Secondary | ICD-10-CM

## 2022-01-29 NOTE — Progress Notes (Signed)
Alexis Terry Date of Birth: [birth year] MRN: [id number] Primary Care Provider:Beal, Earma Reading Former Cardiology Providers: Jeri Lager, APRN, FNP-C Primary Cardiologist: Rex Kras, DO, [facility] (established care 06/20/2020) Cardiothoracic Surgeon: Dr. Lalla Brothers  Date: 01/29/22 Last Office Visit: July 31, 2021  Chief Complaint  Patient presents with   Hypertension   Follow-up    6 months    HPI  Alexis Terry is a Caucasian 51 y.o.  male his past medical history and cardiovascular risk factors include: History of type A aortic dissection status post replacement of the ascending aorta and hemiarch under DHCA with Dr. Lunette Stands at [facility], mixed hyperlipidemia, hypertension, obesity due to excess calories.  Patient presents today for 14-monthfollow-up for blood pressure management given his history of aortic dissection status postsurgical repair.  Patient's home blood pressures remain very well controlled.  Reviewed his home blood pressure log and systolic blood pressures are consistently less than 120 mmHg on visual estimation.  Summarized readings noted in the media section.  Since last office visit patient denies any angina pectoris or heart failure symptoms.  He had a CTA of the chest with and without contrast dissection protocol in [facility]  Records reviewed in [place] noted below for further reference.   ALLERGIES: No Known Allergies   MEDICATION LIST PRIOR TO VISIT: Current Outpatient Medications on File Prior to Visit  Medication Sig Dispense Refill   amLODipine (NORVASC) 5 MG tablet TAKE 1 TABLET(5 MG) BY MOUTH EVERY EVENING 180 tablet 1   aspirin (ASPIRIN LOW DOSE) 81 MG EC tablet TAKE 1 TABLET(81 MG) BY MOUTH DAILY 90 tablet 1   atorvastatin (LIPITOR) 40 MG tablet TAKE 1 TABLET(40 MG) BY MOUTH AT BEDTIME 90 tablet 1   chlorthalidone (HYGROTON) 25 MG tablet Take 0.5 tablets (12.5 mg total) by mouth  daily. 45 tablet 1   hydrALAZINE (APRESOLINE) 50 MG tablet TAKE 1 TABLET(50 MG) BY MOUTH THREE TIMES DAILY 90 tablet 3   ibuprofen (ADVIL,MOTRIN) 200 MG tablet Take 4,000 mg by mouth every 6 (six) hours as needed for moderate pain.      losartan (COZAAR) 100 MG tablet Take 1 tablet (100 mg total) by mouth every morning. 90 tablet 1   metoprolol (TOPROL-XL) 200 MG 24 hr tablet TAKE 1 TABLET(200 MG) BY MOUTH DAILY 90 tablet 0   No current facility-administered medications on file prior to visit.    PAST MEDICAL HISTORY: Past Medical History:  Diagnosis Date   Bilateral carotid bruits    Hypertension    S/P aortic dissection repair    Stroke ([facility] 2011   patient's wife reports "small stroke involving his eye" about 8 years ago, no deficits noted    PAST SURGICAL HISTORY: Past Surgical History:  Procedure Laterality Date   REPAIR OF ACUTE ASCENDING THORACIC AORTIC DISSECTION      FAMILY HISTORY: The patient's family history includes Hypertension in his father and mother.   SOCIAL HISTORY:  The patient  reports that he has never smoked. He has never used smokeless tobacco. He reports that he does not currently use alcohol. He reports that he does not use drugs.  Review of Systems  Cardiovascular:  Negative for chest pain, dyspnea on exertion, leg swelling, near-syncope, palpitations, paroxysmal nocturnal dyspnea and syncope.  Respiratory:  Negative for shortness of breath.    PHYSICAL EXAM: Vitals with BMI 01/29/2022 01/29/2022 07/31/2021  Height - 6' 1"  -  Weight - 250 lbs -  BMI - 89.21 -  Systolic 194 174 081  Diastolic 80 79 77  Pulse 71 64 73   CONSTITUTIONAL: Well-developed and well-nourished. No acute distress.  SKIN: Skin is warm and dry. No rash noted. No cyanosis. No pallor. No jaundice HEAD: Normocephalic and atraumatic.  EYES: No scleral icterus MOUTH/THROAT: Moist oral membranes.  NECK: No JVD present. No thyromegaly noted. Bilateral carotid bruits  LYMPHATIC:  No visible cervical adenopathy.  CHEST Normal respiratory effort. No intercostal retractions.  Sternotomy site is healed well. LUNGS: Clear to auscultation bilaterally.  No stridor. No wheezes. No rales.  CARDIOVASCULAR: Regular, positive S1-S2, no murmurs rubs or gallops appreciated. ABDOMINAL: Obese, soft, nontender, nondistended, positive bowel sounds all 4 quadrants. No apparent ascites.  EXTREMITIES: No peripheral edema  HEMATOLOGIC: No significant bruising NEUROLOGIC: Oriented to person, place, and time. Nonfocal. Normal muscle tone.  PSYCHIATRIC: Normal mood and affect. Normal behavior. Cooperative  RADIOLOGY: CTA chest/abdomen/pelvis 09/02/2018: IMPRESSION: Acute Stanford type A thoracic aortic dissection starting from the aortic root which is aneurysmal in appearance up to 5.1 cm in caliber. Spiraling of the dissection flap is noted with involvement of dissection into the right brachiocephalic and left subclavian arteries.   Extension of dissection into the nonaneurysmal abdominal aorta, both common iliac and external iliac arteries as well as extension into the right internal iliac artery with occluded or near occluded appearance of the distal right common iliac artery and extension of the dissection into the left renal artery.   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  CTA chest with and without contrast at [facility]. Records available in Care Everywhere. 11/30/2021 1.  Repair of a type A aortic dissection, which is stable. No new dissection or increasing stenosis. There is a focal area of increased mural thrombus within the false lumen compared to 10/23/2019 CTA.  2.  Multiple tiny nodules in the left upper lobe are nonspecific and may relate to an infectious or inflammatory process. Attention on follow-up imaging.  CARDIAC DATABASE: EKG: 01/29/2022: NSR, 69 bpm,IRBBB, without underlying injury pattern.    Echocardiogram: 10/23/2018: LVEF 62%, mild LVH, mildly dilated left atrium, mild AR, mild MR, mild TR, PA SP 33 mmHg, aortic root measured 3.8 cm status post ascending aorta and hemiarch replacement.  Exercise treadmill stress test 07/25/2021: Exercise treadmill stress test performed using Bruce protocol.  Patient reached 9.4 METS, and 90% of age predicted maximum heart rate.  Exercise capacity was good.  No chest pain reported.  Normal heart rate and hemodynamic response. Stress EKG revealed no ischemic changes. Low risk study.  Carotid artery duplex 02/14/2021  No hemodynamically significant arterial disease in the internal carotid artery bilaterally.  Antegrade right vertebral artery flow. Antegrade left vertebral artery flow.  Follow up studies if clinically indicated.  Compared to prior study dated 10/23/2018: No significant change.   LABORATORY DATA: January 25, 2022 [facility] available in Care Everywhere Glucose 70 - 99 mg/dL 96   BUN 6 - 24 mg/dL 12   Creatinine 0.76 - 1.27 mg/dL 1.04   eGFR >59 mL/min/1.73 86   BUN/Creatinine Ratio 9 - 20 12   Sodium 134 - 144 mmol/L 143   Potassium 3.5 - 5.2 mmol/L 4.0   Chloride 96 - 106 mmol/L 103   CO2 20 - 29 mmol/L 23   CALCIUM 8.7 - 10.2 mg/dL 9.3    February 22, 2021 [facility] available in Care Everywhere Cholesterol, Total 100 - 199 mg/dL 129  Triglycerides 0 - 149 mg/dL 149    HDL >39 mg/dL 36 Low     VLDL Cholesterol Cal 5 - 40 mg/dL 26    LDL 0 - 99 mg/dL 67    LDL/HDL Ratio 0.0 - 3.6 ratio 1.9      IMPRESSION:    ICD-10-CM   1. Essential hypertension  I10 EKG 12-Lead    2. History of aortic dissection  Z86.79     3. Hx of repair of dissecting thoracic aortic aneurysm, Stanford type A  Z98.890    Z86.79     4. Mixed hyperlipidemia  E78.2     5. Class 1 obesity due to excess calories with serious comorbidity and body mass index (BMI) of 32.0 to 32.9 in adult  E66.09    Z68.32        RECOMMENDATIONS: Alexis Terry is a 51 y.o. male whose past medical history and cardiovascular risk factors include: History of type A aortic dissection status post replacement of the ascending aorta and hemiarch under [employer] with Dr. Lunette Stands at [facility], mixed hyperlipidemia, hypertension, obesity due to excess calories.  Essential hypertension Office blood pressures are not well controlled I suspect that he has whitecoat hypertension. Home blood pressures are very well controlled-Home blood pressure log reviewed.  Systolic blood pressures on visual estimation consistently less than 120 mmHg. Medications reconciled. Recent labs from 01/25/2022 performed at outside facility independently reviewed and noted above for further reference. Patient is educated to call the office if he has SBP consistently greater than 120 mmHg to further uptitrate medical therapy.  History of aortic dissection / Hx of repair of dissecting thoracic aortic aneurysm, Stanford type A Recently had a CTA of the chest at [facility] in January 2023.   Results reviewed and noted above for further reference. Follows w/ Dr. Lunette Stands.  Patient is unaware of these results - I have informed him of the findings and still think he should review them with either the ordering physician or his/her support staff for completeness.   Mixed hyperlipidemia Currently on atorvastatin.   He denies myalgia or other side effects. Most recent lipids dated April 2022 reviewed as noted above. Currently managed by primary care provider -scheduled for fasting lipid profile in April 2023 as part of his well visit.  Class 1 obesity due to excess calories with serious comorbidity and body mass index (BMI) of 32.0 to 32.9 in adult Body mass index is 32.98 kg/m. I reviewed with the patient the importance of diet, regular physical activity/exercise, weight loss.   Patient is educated on increasing physical activity gradually as tolerated.   With the goal of moderate intensity exercise for 30 minutes a day 5 days a week.  FINAL MEDICATION LIST END OF ENCOUNTER: No orders of the defined types were placed in this encounter.    There are no discontinued medications.    Current Outpatient Medications:    amLODipine (NORVASC) 5 MG tablet, TAKE 1 TABLET(5 MG) BY MOUTH EVERY EVENING, Disp: 180 tablet, Rfl: 1   aspirin (ASPIRIN LOW DOSE) 81 MG EC tablet, TAKE 1 TABLET(81 MG) BY MOUTH DAILY, Disp: 90 tablet, Rfl: 1   atorvastatin (LIPITOR) 40 MG tablet, TAKE 1 TABLET(40 MG) BY MOUTH AT BEDTIME, Disp: 90 tablet, Rfl: 1   chlorthalidone (HYGROTON) 25 MG tablet, Take 0.5 tablets (12.5 mg total) by mouth daily., Disp: 45 tablet, Rfl: 1   hydrALAZINE (APRESOLINE) 50 MG tablet, TAKE 1 TABLET(50 MG)  BY MOUTH THREE TIMES DAILY, Disp: 90 tablet, Rfl: 3   ibuprofen (ADVIL,MOTRIN) 200 MG tablet, Take 4,000 mg by mouth every 6 (six) hours as needed for moderate pain. , Disp: , Rfl:    losartan (COZAAR) 100 MG tablet, Take 1 tablet (100 mg total) by mouth every morning., Disp: 90 tablet, Rfl: 1   metoprolol (TOPROL-XL) 200 MG 24 hr tablet, TAKE 1 TABLET(200 MG) BY MOUTH DAILY, Disp: 90 tablet, Rfl: 0  Orders Placed This Encounter  Procedures   EKG 12-Lead   --Continue cardiac medications as reconciled in final medication list. --Return in about 29 weeks (around 08/20/2022) for Follow up, BP, hx of aortic dissection.. Or sooner if needed. --Continue follow-up with your primary care physician regarding the management of your other chronic comorbid conditions.  Patient's questions and concerns were addressed to his satisfaction. He voices understanding of the instructions provided during this encounter.   This note was created using a voice recognition software as a result there may be grammatical errors inadvertently enclosed that do not reflect the nature of this encounter. Every attempt is made to correct such errors.  Rex Kras, [place],  [facility]  Pager: [phone] Office: [phone]

## 2022-02-07 ENCOUNTER — Other Ambulatory Visit: Payer: Self-pay | Admitting: Cardiology

## 2022-02-07 DIAGNOSIS — I10 Essential (primary) hypertension: Secondary | ICD-10-CM

## 2022-03-20 ENCOUNTER — Other Ambulatory Visit: Payer: Self-pay | Admitting: Cardiology

## 2022-04-20 LAB — COLOGUARD: COLOGUARD: NEGATIVE

## 2022-05-19 ENCOUNTER — Other Ambulatory Visit: Payer: Self-pay | Admitting: Cardiology

## 2022-05-19 DIAGNOSIS — I10 Essential (primary) hypertension: Secondary | ICD-10-CM

## 2022-05-29 ENCOUNTER — Other Ambulatory Visit: Payer: Self-pay | Admitting: Cardiology

## 2022-05-29 DIAGNOSIS — I10 Essential (primary) hypertension: Secondary | ICD-10-CM

## 2022-06-13 ENCOUNTER — Other Ambulatory Visit: Payer: Self-pay | Admitting: Cardiology

## 2022-06-13 DIAGNOSIS — I10 Essential (primary) hypertension: Secondary | ICD-10-CM

## 2022-06-16 ENCOUNTER — Other Ambulatory Visit: Payer: Self-pay | Admitting: Cardiology

## 2022-08-05 ENCOUNTER — Other Ambulatory Visit: Payer: Self-pay | Admitting: Cardiology

## 2022-08-05 DIAGNOSIS — E782 Mixed hyperlipidemia: Secondary | ICD-10-CM

## 2022-08-20 ENCOUNTER — Other Ambulatory Visit: Payer: Self-pay | Admitting: Cardiology

## 2022-08-20 DIAGNOSIS — I10 Essential (primary) hypertension: Secondary | ICD-10-CM

## 2022-08-21 ENCOUNTER — Ambulatory Visit: Payer: BC Managed Care – PPO | Admitting: Cardiology

## 2022-08-21 ENCOUNTER — Encounter: Payer: Self-pay | Admitting: Cardiology

## 2022-08-21 VITALS — BP 149/76 | HR 74 | Temp 98.1°F | Resp 16 | Ht 73.0 in | Wt 238.8 lb

## 2022-08-21 DIAGNOSIS — I10 Essential (primary) hypertension: Secondary | ICD-10-CM

## 2022-08-21 DIAGNOSIS — Z8679 Personal history of other diseases of the circulatory system: Secondary | ICD-10-CM

## 2022-08-21 DIAGNOSIS — E6609 Other obesity due to excess calories: Secondary | ICD-10-CM

## 2022-08-21 DIAGNOSIS — E782 Mixed hyperlipidemia: Secondary | ICD-10-CM

## 2022-08-21 NOTE — Progress Notes (Signed)
Alexis Terry Date of Birth: 08-22-1971 MRN: [id number] Primary Care Provider:Beal, Earma Reading Former Cardiology Providers: Jeri Lager, APRN, FNP-C Primary Cardiologist: Rex Kras, DO, [facility] (established care 06/20/2020) Cardiothoracic Surgeon: Dr. Lalla Brothers  Date: 08/21/22 Last Office Visit: 01/29/2022  Chief Complaint  Patient presents with   hx of aortic dissection   Hypertension   Follow-up    HPI  Alexis Terry is a Caucasian 51 y.o.  male his past medical history and cardiovascular risk factors include: History of type A aortic dissection status post replacement of the ascending aorta and hemiarch under [employer] with Dr. Lunette Stands at [facility], mixed hyperlipidemia, hypertension, obesity due to excess calories.  Presents today for 69-monthfollow-up visit for blood pressure management given history of aortic dissection status postsurgical repair.   Home blood pressures are well controlled and images have been documented under the media section.  He has implemented lifestyle changes with increased ambulation daily to up to 5 miles, minimizing salt and sugar significantly.  He is lost approximately 12 pounds over the last 6 months for which she is congratulated for today's office visit.  He still motivated to lose weight and hopefully get into a healthy BMI.  With regards to his history of aortic dissection he has a CT scan once every 2 years.  His last CT was in January 2023.  ALLERGIES: No Known Allergies   MEDICATION LIST PRIOR TO VISIT: Current Outpatient Medications on File Prior to Visit  Medication Sig Dispense Refill   amLODipine (NORVASC) 5 MG tablet TAKE 1 TABLET BY MOUTH EVERY EVENING 180 tablet 1   aspirin (ASPIRIN LOW DOSE) 81 MG EC tablet TAKE 1 TABLET(81 MG) BY MOUTH DAILY 90 tablet 1   atorvastatin (LIPITOR) 40 MG tablet TAKE 1 TABLET(40 MG) BY MOUTH AT BEDTIME 90 tablet 1   chlorthalidone (HYGROTON) 25 MG tablet Take 0.5 tablets (12.5 mg total) by  mouth daily. 45 tablet 1   hydrALAZINE (APRESOLINE) 50 MG tablet TAKE 1 TABLET(50 MG) BY MOUTH THREE TIMES DAILY 270 tablet 0   losartan (COZAAR) 100 MG tablet TAKE 1 TABLET(100 MG) BY MOUTH EVERY MORNING 90 tablet 1   metoprolol (TOPROL-XL) 200 MG 24 hr tablet TAKE 1 TABLET(200 MG) BY MOUTH DAILY 90 tablet 0   No current facility-administered medications on file prior to visit.    PAST MEDICAL HISTORY: Past Medical History:  Diagnosis Date   Bilateral carotid bruits    Hypertension    S/P aortic dissection repair    Stroke ([facility] 2011   patient's wife reports "small stroke involving his eye" about 8 years ago, no deficits noted    PAST SURGICAL HISTORY: Past Surgical History:  Procedure Laterality Date   REPAIR OF ACUTE ASCENDING THORACIC AORTIC DISSECTION      FAMILY HISTORY: The patient's family history includes Hypertension in his father and mother.   SOCIAL HISTORY:  The patient  reports that he has never smoked. He has never used smokeless tobacco. He reports that he does not currently use alcohol. He reports that he does not use drugs.  Review of Systems  Cardiovascular:  Negative for chest pain, dyspnea on exertion, leg swelling, near-syncope, palpitations, paroxysmal nocturnal dyspnea and syncope.  Respiratory:  Negative for shortness of breath.     PHYSICAL EXAM:    08/21/2022    9:52 AM 08/21/2022    9:51 AM 01/29/2022    9:56 AM  Vitals with BMI  Height  6' 1"   Weight  238 lbs 13 oz   BMI  06.26   Systolic 948 546 270  Diastolic 76 76 80  Pulse 74 70 71   CONSTITUTIONAL: Well-developed and well-nourished. No acute distress.  SKIN: Skin is warm and dry. No rash noted. No cyanosis. No pallor. No jaundice HEAD: Normocephalic and atraumatic.  EYES: No scleral icterus MOUTH/THROAT: Moist oral membranes.  NECK: No JVD present. No thyromegaly noted. Bilateral carotid bruits  CHEST Normal respiratory effort. No intercostal retractions.  Sternotomy site is  healed well. LUNGS: Clear to auscultation bilaterally.  No stridor. No wheezes. No rales.  CARDIOVASCULAR: Regular, positive S1-S2, no murmurs rubs or gallops appreciated. ABDOMINAL: Obese, soft, nontender, nondistended, positive bowel sounds all 4 quadrants. No apparent ascites.  EXTREMITIES: No peripheral edema  HEMATOLOGIC: No significant bruising NEUROLOGIC: Oriented to person, place, and time. Nonfocal. Normal muscle tone.  PSYCHIATRIC: Normal mood and affect. Normal behavior. Cooperative  RADIOLOGY: CTA chest/abdomen/pelvis 09/02/2018: IMPRESSION: Acute Stanford type A thoracic aortic dissection starting from the aortic root which is aneurysmal in appearance up to 5.1 cm in caliber. Spiraling of the dissection flap is noted with involvement of dissection into the right brachiocephalic and left subclavian arteries.   Extension of dissection into the nonaneurysmal abdominal aorta, both common iliac and external iliac arteries as well as extension into the right internal iliac artery with occluded or near occluded appearance of the distal right common iliac artery and extension of the dissection into the left renal artery.   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  CTA chest with and without contrast at [facility]. Records available in Care Everywhere. 11/30/2021 1.  Repair of a type A aortic dissection, which is stable. No new dissection or increasing stenosis. There is a focal area of increased mural thrombus within the false lumen compared to 10/23/2019 CTA.  2.  Multiple tiny nodules in the left upper lobe are nonspecific and may relate to an infectious or inflammatory process. Attention on follow-up imaging.  CARDIAC DATABASE: EKG: 08/21/2022 Normal sinus rhythm, 71 bpm, LAE, IRBBB, LVH with voltage criteria (aVL).  Echocardiogram: 10/23/2018: LVEF 62%, mild LVH, mildly dilated left atrium, mild AR, mild MR, mild TR,  PA SP 33 mmHg, aortic root measured 3.8 cm status post ascending aorta and hemiarch replacement.  Exercise treadmill stress test 07/25/2021: Exercise treadmill stress test performed using Bruce protocol.  Patient reached 9.4 METS, and 90% of age predicted maximum heart rate.  Exercise capacity was good.  No chest pain reported.  Normal heart rate and hemodynamic response. Stress EKG revealed no ischemic changes. Low risk study.  Carotid artery duplex 02/14/2021  No hemodynamically significant arterial disease in the internal carotid artery bilaterally.  Antegrade right vertebral artery flow. Antegrade left vertebral artery flow.  Follow up studies if clinically indicated.  Compared to prior study dated 10/23/2018: No significant change.   LABORATORY DATA: January 25, 2022 [facility] available in Care Everywhere Glucose 70 - 99 mg/dL 96   BUN 6 - 24 mg/dL 12   Creatinine 0.76 - 1.27 mg/dL 1.04   eGFR >59 mL/min/1.73 86   BUN/Creatinine Ratio 9 - 20 12   Sodium 134 - 144 mmol/L 143   Potassium 3.5 - 5.2 mmol/L 4.0   Chloride 96 - 106 mmol/L 103   CO2 20 - 29 mmol/L 23   CALCIUM 8.7 - 10.2 mg/dL 9.3    February 22, 2021 [facility] available in Care Everywhere Cholesterol, Total 100 -  199 mg/dL 129    Triglycerides 0 - 149 mg/dL 149    HDL >39 mg/dL 36 Low     VLDL Cholesterol Cal 5 - 40 mg/dL 26    LDL 0 - 99 mg/dL 67    LDL/HDL Ratio 0.0 - 3.6 ratio 1.9     04/27/2022 [facility] in Care Everywhere: Glucose 70 - 99 mg/dL 118 High    BUN 6 - 24 mg/dL 19   Creatinine 0.76 - 1.27 mg/dL 1.08   eGFR >59 mL/min/1.73 82   BUN/Creatinine Ratio 9 - 20 18   Sodium 134 - 144 mmol/L 139   Potassium 3.5 - 5.2 mmol/L 3.9   Chloride 96 - 106 mmol/L 100   CO2 20 - 29 mmol/L 22   CALCIUM 8.7 - 10.2 mg/dL 9.8   Total Protein 6.0 - 8.5 g/dL 7.4   Albumin, Serum 3.8 - 4.9 g/dL 4.4   Globulin, Total 1.5 - 4.5 g/dL 3.0   Albumin/Globulin Ratio 1.2 - 2.2 1.5   Total Bilirubin 0.0 - 1.2 mg/dL 0.5    Alkaline Phosphatase 44 - 121 IU/L 102   AST 0 - 40 IU/L 24   ALT (SGPT) 0 - 44 IU/L 28      IMPRESSION:    ICD-10-CM   1. Essential hypertension  I10 EKG 12-Lead    PCV ECHOCARDIOGRAM COMPLETE    2. History of aortic dissection  Z86.79     3. Hx of repair of dissecting thoracic aortic aneurysm, Stanford type A  Z98.890    Z86.79     4. Mixed hyperlipidemia  E78.2     5. Class 1 obesity due to excess calories with serious comorbidity and body mass index (BMI) of 31.0 to 31.9 in adult  E66.09    Z68.31        RECOMMENDATIONS: Alexis Terry is a 51 y.o. male whose past medical history and cardiovascular risk factors include: History of type A aortic dissection status post replacement of the ascending aorta and hemiarch under [employer]CA with Dr. Lunette Stands at [facility], mixed hyperlipidemia, hypertension, obesity due to excess calories.  Essential hypertension Office blood pressures are not well controlled. Home blood pressures are very well controlled readings documented under the media section. Medications reconciled. Reemphasized importance of low-salt diet. He is implemented lifestyle changes and has lost approximately 12 pounds in the last office visit. He is walking up to 5 miles per day and at least 10,000 steps daily.  History of aortic dissection /hx of repair of dissecting thoracic aortic aneurysm, Stanford type A His last CT of the chest was in January 2023 at [facility] [address] Patient states that he has a CT scan scheduled once every 12 to 2 years. Follows up with Dr. Lunette Stands on as-needed basis.  Mixed hyperlipidemia Currently on atorvastatin.   He denies myalgia or other side effects. Currently managed by primary care provider.  Class 1 obesity due to excess calories with serious comorbidity and body mass index (BMI) of 31.0 to 31.9 in adult Patient has lost 12 pounds since last office visit for which he is congratulated for today's office  visit. Body mass index is 31.51 kg/m. I reviewed with the patient the importance of diet, regular physical activity/exercise, weight loss.   Patient is educated on increasing physical activity gradually as tolerated.  With the goal of moderate intensity exercise for 30 minutes a day 5 days a week.  FINAL MEDICATION LIST END OF ENCOUNTER: No orders  of the defined types were placed in this encounter.    Medications Discontinued During This Encounter  Medication Reason   ibuprofen (ADVIL,MOTRIN) 200 MG tablet      Current Outpatient Medications:    amLODipine (NORVASC) 5 MG tablet, TAKE 1 TABLET BY MOUTH EVERY EVENING, Disp: 180 tablet, Rfl: 1   aspirin (ASPIRIN LOW DOSE) 81 MG EC tablet, TAKE 1 TABLET(81 MG) BY MOUTH DAILY, Disp: 90 tablet, Rfl: 1   atorvastatin (LIPITOR) 40 MG tablet, TAKE 1 TABLET(40 MG) BY MOUTH AT BEDTIME, Disp: 90 tablet, Rfl: 1   chlorthalidone (HYGROTON) 25 MG tablet, Take 0.5 tablets (12.5 mg total) by mouth daily., Disp: 45 tablet, Rfl: 1   hydrALAZINE (APRESOLINE) 50 MG tablet, TAKE 1 TABLET(50 MG) BY MOUTH THREE TIMES DAILY, Disp: 270 tablet, Rfl: 0   losartan (COZAAR) 100 MG tablet, TAKE 1 TABLET(100 MG) BY MOUTH EVERY MORNING, Disp: 90 tablet, Rfl: 1   metoprolol (TOPROL-XL) 200 MG 24 hr tablet, TAKE 1 TABLET(200 MG) BY MOUTH DAILY, Disp: 90 tablet, Rfl: 0  Orders Placed This Encounter  Procedures   EKG 12-Lead   PCV ECHOCARDIOGRAM COMPLETE   --Continue cardiac medications as reconciled in final medication list. --Return in about 6 months (around 03/06/2023) for Follow up HTN, history of aortic dissection. Or sooner if needed. --Continue follow-up with your primary care physician regarding the management of your other chronic comorbid conditions.  Patient's questions and concerns were addressed to his satisfaction. He voices understanding of the instructions provided during this encounter.   This note was created using a voice recognition software as a  result there may be grammatical errors inadvertently enclosed that do not reflect the nature of this encounter. Every attempt is made to correct such errors.  Rex Kras, [place], [facility]  Pager: [phone] Office: [phone]

## 2022-09-11 ENCOUNTER — Other Ambulatory Visit: Payer: Self-pay | Admitting: Cardiology

## 2022-09-11 DIAGNOSIS — I10 Essential (primary) hypertension: Secondary | ICD-10-CM

## 2022-09-24 ENCOUNTER — Other Ambulatory Visit: Payer: Self-pay

## 2022-09-24 ENCOUNTER — Other Ambulatory Visit: Payer: Self-pay | Admitting: Cardiology

## 2022-09-24 DIAGNOSIS — I10 Essential (primary) hypertension: Secondary | ICD-10-CM

## 2022-09-24 MED ORDER — METOPROLOL SUCCINATE ER 200 MG PO TB24: ORAL_TABLET | ORAL | 2 refills | Status: DC

## 2022-09-24 MED ORDER — HYDRALAZINE HCL 50 MG PO TABS: ORAL_TABLET | ORAL | 0 refills | Status: DC

## 2022-12-12 ENCOUNTER — Other Ambulatory Visit: Payer: Self-pay | Admitting: Cardiology

## 2022-12-12 DIAGNOSIS — I10 Essential (primary) hypertension: Secondary | ICD-10-CM

## 2023-02-07 ENCOUNTER — Other Ambulatory Visit: Payer: Self-pay | Admitting: Cardiology

## 2023-02-07 DIAGNOSIS — E782 Mixed hyperlipidemia: Secondary | ICD-10-CM

## 2023-02-26 ENCOUNTER — Ambulatory Visit: Payer: No Typology Code available for payment source

## 2023-02-26 DIAGNOSIS — I10 Essential (primary) hypertension: Secondary | ICD-10-CM

## 2023-03-05 NOTE — Progress Notes (Signed)
Called patient to inform him about his echo. Patient understood

## 2023-03-06 ENCOUNTER — Ambulatory Visit: Payer: BC Managed Care – PPO | Admitting: Cardiology

## 2023-03-07 ENCOUNTER — Ambulatory Visit: Payer: No Typology Code available for payment source | Admitting: Cardiology

## 2023-03-07 ENCOUNTER — Encounter: Payer: Self-pay | Admitting: Cardiology

## 2023-03-07 VITALS — BP 138/72 | HR 80 | Resp 17 | Ht 73.0 in | Wt 235.6 lb

## 2023-03-07 DIAGNOSIS — E782 Mixed hyperlipidemia: Secondary | ICD-10-CM

## 2023-03-07 DIAGNOSIS — E6609 Other obesity due to excess calories: Secondary | ICD-10-CM

## 2023-03-07 DIAGNOSIS — Z8679 Personal history of other diseases of the circulatory system: Secondary | ICD-10-CM

## 2023-03-07 DIAGNOSIS — I10 Essential (primary) hypertension: Secondary | ICD-10-CM

## 2023-03-07 MED ORDER — LOSARTAN POTASSIUM 100 MG PO TABS: 100.0000 mg | ORAL_TABLET | Freq: Every day | ORAL | 1 refills | Status: DC

## 2023-03-07 NOTE — Progress Notes (Signed)
Maye Hides Date of Birth: March 09, 1971 MRN: [id number] Primary Care Provider:Beal, Christa See Former Cardiology Providers: Altamese Dayton, APRN, FNP-C Primary Cardiologist: Tessa Lerner, DO, [facility] (established care 06/20/2020) Cardiothoracic Surgeon: Dr. Aurea Graff  Date: 03/07/23 Last Office Visit: 08/21/2022   Chief Complaint  Patient presents with   Hypertension   Follow-up    HPI  Alexis Terry is a Caucasian 52 y.o.  male his past medical history and cardiovascular risk factors include: History of type A aortic dissection status post replacement of the ascending aorta and hemiarch under DHCA with Dr. Ty Hilts at [facility], mixed hyperlipidemia, hypertension, obesity due to excess calories.  Patient presents today for 29-month follow-up visit given his history of aortic dissection status postsurgical repair.  He is accompanied by his wife at today's office visit.  Since last office visit he denies any anginal chest pain or heart failure symptoms.  Overall functional capacity remains stable.  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  At the last office visit he had lost 12 pounds over 6 months and now he is lost an additional 3 pounds since last office visit.  He is congratulated on his efforts.  Office blood pressures are not well-controlled on current medical therapy.  However based on his home blood pressure log his SBP is consistently less than 123 mmHg.  He follows up with his cardiothoracic surgeon at [facility] on a regular basis.  And he has a CT of the chest given his history of aortic dissection once every 2 years.  His last study was in January 2023.  ALLERGIES: No Known Allergies   MEDICATION LIST PRIOR TO VISIT: Current Outpatient Medications on File Prior to Visit  Medication Sig Dispense Refill   amLODipine (NORVASC) 5 MG tablet TAKE 1 TABLET BY MOUTH EVERY  EVENING 180 tablet 1   aspirin (ASPIRIN LOW DOSE) 81 MG EC tablet TAKE 1 TABLET(81 MG) BY MOUTH DAILY 90 tablet 1   atorvastatin (LIPITOR) 40 MG tablet TAKE 1 TABLET(40 MG) BY MOUTH AT BEDTIME 90 tablet 1   chlorthalidone (HYGROTON) 25 MG tablet TAKE 1/2 TABLET(12.5 MG) BY MOUTH DAILY 45 tablet 1   hydrALAZINE (APRESOLINE) 50 MG tablet TAKE 1 TABLET(50 MG) BY MOUTH THREE TIMES DAILY 270 tablet 0   metoprolol (TOPROL-XL) 200 MG 24 hr tablet TAKE 1 TABLET(200 MG) BY MOUTH DAILY 90 tablet 2   No current facility-administered medications on file prior to visit.    PAST MEDICAL HISTORY: Past Medical History:  Diagnosis Date   Bilateral carotid bruits    Hypertension    S/P aortic dissection repair    Stroke 2011   patient's wife reports "small stroke involving his eye" about 8 years ago, no deficits noted    PAST SURGICAL HISTORY: Past Surgical History:  Procedure Laterality Date   REPAIR OF ACUTE ASCENDING THORACIC AORTIC DISSECTION      FAMILY HISTORY: The patient's family history includes Hypertension in his father and mother.   SOCIAL HISTORY:  The patient  reports that he has never smoked. He has never used smokeless tobacco. He reports that he does not currently use alcohol. He reports that he does not use drugs.  Review of Systems  Cardiovascular:  Negative for chest pain, dyspnea on exertion, leg swelling, near-syncope, palpitations, paroxysmal nocturnal dyspnea and syncope.  Respiratory:  Negative for shortness of breath.     PHYSICAL  EXAM:    03/07/2023    9:10 AM 08/21/2022    9:52 AM 08/21/2022    9:51 AM  Vitals with BMI  Height     Weight 235 lbs 10 oz  238 lbs 13 oz  BMI 31.09  31.51  Systolic 138 149 161  Diastolic 72 76 76  Pulse 80 74 70   CONSTITUTIONAL: Well-developed and well-nourished. No acute distress.  SKIN: Skin is warm and dry. No rash noted. No cyanosis. No pallor. No jaundice HEAD: Normocephalic and atraumatic.  EYES: No scleral  icterus MOUTH/THROAT: Moist oral membranes.  NECK: No JVD present. No thyromegaly noted. Bilateral carotid bruits  CHEST Normal respiratory effort. No intercostal retractions.  Sternotomy site is healed well. LUNGS: Clear to auscultation bilaterally.  No stridor. No wheezes. No rales.  CARDIOVASCULAR: Regular, positive S1-S2, no murmurs rubs or gallops appreciated. ABDOMINAL: Obese, soft, nontender, nondistended, positive bowel sounds all 4 quadrants. No apparent ascites.  EXTREMITIES: No peripheral edema  HEMATOLOGIC: No significant bruising NEUROLOGIC: Oriented to person, place, and time. Nonfocal. Normal muscle tone.  PSYCHIATRIC: Normal mood and affect. Normal behavior. Cooperative  RADIOLOGY: CTA chest/abdomen/pelvis 09/02/2018: IMPRESSION: Acute Stanford type A thoracic aortic dissection starting from the aortic root which is aneurysmal in appearance up to 5.1 cm in caliber. Spiraling of the dissection flap is noted with involvement of dissection into the right brachiocephalic and left subclavian arteries.   Extension of dissection into the nonaneurysmal abdominal aorta, both common iliac and external iliac arteries as well as extension into the right internal iliac artery with occluded or near occluded appearance of the distal right common iliac artery and extension of the dissection into the left renal artery.   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  [facility] chest with and without contrast at [facility]. Records available in Care Everywhere. 11/30/2021 1.  Repair of a type A aortic dissection, which is stable. No new dissection or increasing stenosis. There is a focal area of increased mural thrombus within the false lumen compared to 10/23/2019 CTA.  2.  Multiple tiny nodules in the left upper lobe are nonspecific and may relate to an infectious or inflammatory process. Attention on follow-up imaging.  CARDIAC  DATABASE: EKG: 03/07/23 Sinus Rhythm 78bpm LVH per voltage (avRL) IRBBB Left atrial enlargement. Without underlying injury pattern.  Echocardiogram: 10/23/2018: LVEF 62%, mild LVH, mildly dilated left atrium, mild AR, mild MR, mild TR, PA SP 33 mmHg, aortic root measured 3.8 cm status post ascending aorta and hemiarch replacement.  02/26/2023:  Normal LV systolic function with visual EF 55-60%. Left ventricle cavity is normal in size. Normal left ventricular wall thickness. Normal global wall motion. Doppler evidence of grade I (impaired) diastolic dysfunction, normal LAP. Calculated EF 52%. Left atrial cavity is mildly dilated. Structurally normal trileaflet aortic valve. Mild (Grade I) aortic regurgitation. Structurally normal mitral valve. Mild (Grade I) mitral regurgitation. Structurally normal tricuspid valve. Mild tricuspid regurgitation. No evidence of pulmonary hypertension. RVSP measures 31 mmHg. The aortic root is mildly dilated at 4.2 cm. Pt. is s/p ascending aorta and hemiarch replacement. Compared to 10/2018, aortic root is slightly more dilated.   Exercise treadmill stress test 07/25/2021: Exercise treadmill stress test performed using Bruce protocol.  Patient reached 9.4 METS, and 90% of age predicted maximum heart rate.  Exercise capacity was good.  No chest pain reported.  Normal heart rate and hemodynamic response. Stress EKG revealed no ischemic changes. Low risk  study.  Carotid artery duplex 02/14/2021  No hemodynamically significant arterial disease in the internal carotid artery bilaterally.  Antegrade right vertebral artery flow. Antegrade left vertebral artery flow.  Follow up studies if clinically indicated.  Compared to prior study dated 10/23/2018: No significant change.   LABORATORY DATA: January 25, 2022 [facility] available in Care Everywhere Glucose 70 - 99 mg/dL 96   BUN 6 - 24 mg/dL 12   Creatinine 1.61 - 1.27 mg/dL 0.96   eGFR >04 VW/UJW/1.19 86    BUN/Creatinine Ratio 9 - 20 12   Sodium 134 - 144 mmol/L 143   Potassium 3.5 - 5.2 mmol/L 4.0   Chloride 96 - 106 mmol/L 103   CO2 20 - 29 mmol/L 23   CALCIUM 8.7 - 10.2 mg/dL 9.3    February 22, 2021 [facility] available in Care Everywhere Cholesterol, Total 100 - 199 mg/dL 147    Triglycerides 0 - 149 mg/dL 829    HDL >56 mg/dL 36 Low     VLDL Cholesterol Cal 5 - 40 mg/dL 26    LDL 0 - 99 mg/dL 67    LDL/HDL Ratio 0.0 - 3.6 ratio 1.9     04/27/2022 [facility] in Care Everywhere: Glucose 70 - 99 mg/dL 213 High    BUN 6 - 24 mg/dL 19   Creatinine 0.86 - 1.27 mg/dL 5.78   eGFR >46 NG/EXB/2.84 82   BUN/Creatinine Ratio 9 - 20 18   Sodium 134 - 144 mmol/L 139   Potassium 3.5 - 5.2 mmol/L 3.9   Chloride 96 - 106 mmol/L 100   CO2 20 - 29 mmol/L 22   CALCIUM 8.7 - 10.2 mg/dL 9.8   Total Protein 6.0 - 8.5 g/dL 7.4   Albumin, Serum 3.8 - 4.9 g/dL 4.4   Globulin, Total 1.5 - 4.5 g/dL 3.0   Albumin/Globulin Ratio 1.2 - 2.2 1.5   Total Bilirubin 0.0 - 1.2 mg/dL 0.5   Alkaline Phosphatase 44 - 121 IU/L 102   AST 0 - 40 IU/L 24   ALT (SGPT) 0 - 44 IU/L 28      IMPRESSION:    ICD-10-CM   1. Essential hypertension  I10 losartan (COZAAR) 100 MG tablet    2. History of aortic dissection  Z86.79 EKG 12-Lead    3. Hx of repair of dissecting thoracic aortic aneurysm, Stanford type A  Z98.890    Z86.79     4. Mixed hyperlipidemia  E78.2     5. Class 1 obesity due to excess calories with serious comorbidity and body mass index (BMI) of 31.0 to 31.9 in adult  E66.09    Z68.31        RECOMMENDATIONS: Alexis Terry is a 52 y.o. male whose past medical history and cardiovascular risk factors include: History of type A aortic dissection status post replacement of the ascending aorta and hemiarch under DHCA with Dr. Ty Hilts at [facility], mixed hyperlipidemia, hypertension, obesity due to excess calories.  Essential hypertension History of aortic dissection Hx of repair of  dissecting thoracic aortic aneurysm, Stanford type A Office blood pressure not well-controlled when compared to home blood pressure readings.   A copy of his blood pressure readings is uploaded in the media section Continue current medical therapy. Reemphasized importance of low-salt diet. Given his history of aortic dissection would recommend a goal SBP under 120 mmHg if able to tolerate. Follows up with his cardiothoracic surgeon remotely once every 2 years after he had  a CT of the chest.  Last CT was done in January 2023. Most recent echocardiogram from April 2024 independently reviewed results noted above for reference.  Mixed hyperlipidemia Currently on atorvastatin.   He denies myalgia or other side effects. Currently managed by primary care provider. No recent labs available for review but he is due for a yearly physical in 2 months or so and will send [place] a copy for reference.  Class 1 obesity due to excess calories with serious comorbidity and body mass index (BMI) of 31.0 to 31.9 in adult Body mass index is 31.08 kg/m. Has lost 15 pounds over the last 1 year due to lifestyle changes.  He is congratulated for his weight loss journey. I reviewed with him importance of diet, regular physical activity/exercise, weight loss.   Patient is educated on the importance of increasing physical activity gradually as tolerated with a goal of moderate intensity exercise for 30 minutes a day 5 days a week.  FINAL MEDICATION LIST END OF ENCOUNTER: Meds ordered this encounter  Medications   losartan (COZAAR) 100 MG tablet    Sig: Take 1 tablet (100 mg total) by mouth daily.    Dispense:  90 tablet    Refill:  1     Medications Discontinued During This Encounter  Medication Reason   losartan (COZAAR) 100 MG tablet Reorder      Current Outpatient Medications:    amLODipine (NORVASC) 5 MG tablet, TAKE 1 TABLET BY MOUTH EVERY EVENING, Disp: 180 tablet, Rfl: 1   aspirin (ASPIRIN LOW DOSE) 81  MG EC tablet, TAKE 1 TABLET(81 MG) BY MOUTH DAILY, Disp: 90 tablet, Rfl: 1   atorvastatin (LIPITOR) 40 MG tablet, TAKE 1 TABLET(40 MG) BY MOUTH AT BEDTIME, Disp: 90 tablet, Rfl: 1   chlorthalidone (HYGROTON) 25 MG tablet, TAKE 1/2 TABLET(12.5 MG) BY MOUTH DAILY, Disp: 45 tablet, Rfl: 1   hydrALAZINE (APRESOLINE) 50 MG tablet, TAKE 1 TABLET(50 MG) BY MOUTH THREE TIMES DAILY, Disp: 270 tablet, Rfl: 0   metoprolol (TOPROL-XL) 200 MG 24 hr tablet, TAKE 1 TABLET(200 MG) BY MOUTH DAILY, Disp: 90 tablet, Rfl: 2   losartan (COZAAR) 100 MG tablet, Take 1 tablet (100 mg total) by mouth daily., Disp: 90 tablet, Rfl: 1  Orders Placed This Encounter  Procedures   EKG 12-Lead   --Continue cardiac medications as reconciled in final medication list. --Return in about 6 months (around 09/06/2023) for Follow up, BP, Hx of aortic dissection . Or sooner if needed. --Continue follow-up with your primary care physician regarding the management of your other chronic comorbid conditions.  Patient's questions and concerns were addressed to his satisfaction. He voices understanding of the instructions provided during this encounter.   This note was created using a voice recognition software as a result there may be grammatical errors inadvertently enclosed that do not reflect the nature of this encounter. Every attempt is made to correct such errors.  Tessa Lerner, [place], [facility]  Pager:  [phone] Office: [phone]

## 2023-03-25 ENCOUNTER — Other Ambulatory Visit: Payer: Self-pay | Admitting: Cardiology

## 2023-03-25 DIAGNOSIS — I10 Essential (primary) hypertension: Secondary | ICD-10-CM

## 2023-06-16 ENCOUNTER — Other Ambulatory Visit: Payer: Self-pay | Admitting: Cardiology

## 2023-06-16 DIAGNOSIS — I10 Essential (primary) hypertension: Secondary | ICD-10-CM

## 2023-06-20 ENCOUNTER — Other Ambulatory Visit: Payer: Self-pay | Admitting: Cardiology

## 2023-06-20 DIAGNOSIS — I10 Essential (primary) hypertension: Secondary | ICD-10-CM

## 2023-07-11 ENCOUNTER — Other Ambulatory Visit: Payer: Self-pay | Admitting: Cardiology

## 2023-07-11 DIAGNOSIS — I10 Essential (primary) hypertension: Secondary | ICD-10-CM

## 2023-07-20 ENCOUNTER — Other Ambulatory Visit: Payer: Self-pay | Admitting: Cardiology

## 2023-07-20 DIAGNOSIS — I10 Essential (primary) hypertension: Secondary | ICD-10-CM

## 2023-07-31 ENCOUNTER — Other Ambulatory Visit: Payer: Self-pay | Admitting: Cardiology

## 2023-07-31 DIAGNOSIS — E782 Mixed hyperlipidemia: Secondary | ICD-10-CM

## 2023-08-16 ENCOUNTER — Other Ambulatory Visit: Payer: Self-pay | Admitting: Cardiology

## 2023-08-16 DIAGNOSIS — I10 Essential (primary) hypertension: Secondary | ICD-10-CM

## 2023-09-05 ENCOUNTER — Ambulatory Visit: Payer: Self-pay | Admitting: Cardiology

## 2023-09-15 ENCOUNTER — Other Ambulatory Visit: Payer: Self-pay | Admitting: Cardiology

## 2023-09-15 DIAGNOSIS — I10 Essential (primary) hypertension: Secondary | ICD-10-CM

## 2023-11-10 ENCOUNTER — Other Ambulatory Visit: Payer: Self-pay | Admitting: Cardiology

## 2023-11-10 DIAGNOSIS — I10 Essential (primary) hypertension: Secondary | ICD-10-CM

## 2023-11-19 ENCOUNTER — Encounter: Payer: Self-pay | Admitting: Cardiology

## 2023-11-19 ENCOUNTER — Ambulatory Visit: Payer: BC Managed Care – PPO | Attending: Cardiology | Admitting: Cardiology

## 2023-11-19 VITALS — BP 136/80 | HR 70 | Ht 73.0 in | Wt 239.0 lb

## 2023-11-19 DIAGNOSIS — Z6831 Body mass index (BMI) 31.0-31.9, adult: Secondary | ICD-10-CM

## 2023-11-19 DIAGNOSIS — E66811 Obesity, class 1: Secondary | ICD-10-CM

## 2023-11-19 DIAGNOSIS — I10 Essential (primary) hypertension: Secondary | ICD-10-CM | POA: Diagnosis not present

## 2023-11-19 DIAGNOSIS — E782 Mixed hyperlipidemia: Secondary | ICD-10-CM

## 2023-11-19 DIAGNOSIS — Z9889 Other specified postprocedural states: Secondary | ICD-10-CM

## 2023-11-19 DIAGNOSIS — E6609 Other obesity due to excess calories: Secondary | ICD-10-CM

## 2023-11-19 DIAGNOSIS — Z8679 Personal history of other diseases of the circulatory system: Secondary | ICD-10-CM

## 2023-11-19 NOTE — Progress Notes (Signed)
 Cardiology Office Note:  .   Date:  11/19/2023  ID:  Alexis Terry, DOB Jan 01, 1971, MRN [id number] PCP:  Samie Frederick, PA-C  Former Cardiology Providers: Emmalene Lawrence, APRN, FNP-C  Cardiothoracic Surgeon: Dr. [place] Rana [facility] Providers Cardiologist:  Madonna Large, DO , [facility] (established care August 2021) Electrophysiologist:  None  Click to update primary MD,subspecialty MD or APP then REFRESH:1}    Chief Complaint  Patient presents with   Follow-up    6 month  History of aortic dissection.    History of Present Illness: .   Alexis Terry is a 52 y.o. Caucasian male whose past medical history and cardiovascular risk factors includes: History of type A aortic dissection status post replacement of the ascending aorta and hemiarch under [facility]CA with Dr. Rana at [facility], mixed hyperlipidemia, hypertension, obesity due to excess calories.   Patient being followed by the practice given his history of aortic dissection status postsurgical repair.  He follows up with cardiothoracic surgery at [facility] on a regular basis.  Given his history of the following with serial imaging of the chest once every 2 years, last study was in January 2023.  Since last office visit, denies anginal chest pain or heart failure symptoms.  Home blood pressures are very well-controlled with average BP 117/60.  His ambulatory blood pressure log reviewed and noted below for further reference.  He continues to ambulate at least 10,000 steps per day at his work.    Review of Systems: .   Review of Systems  Cardiovascular:  Negative for chest pain, dyspnea on exertion, leg swelling, near-syncope, palpitations, paroxysmal nocturnal dyspnea and syncope.  Respiratory:  Negative for shortness of breath.     Studies Reviewed:   EKG: EKG Interpretation Date/Time:  Tuesday November 19 2023 08:34:04 EST Ventricular Rate:  70 PR Interval:  174 QRS Duration:  98 QT Interval:  424 QTC  Calculation: 457 R Axis:   1  Text Interpretation: Normal sinus rhythm Normal ECG When compared with ECG of 02-Sep-2018 16:11, No significant change was found Confirmed by Large Madonna [phone]) on 11/19/2023 8:48:43 AM  Echocardiogram: 02/26/2023: LVEF 55 to 60%. Grade 1 diastolic dysfunction. Regurgitation: Mild AR, mild MR, mild TR, No pulmonary hypertension, RVSP 31 mmHg. Aortic root is mildly dilated at 4.2 cm. Pt. is s/p ascending aorta and hemiarch replacement.   Stress Testing: Exercise treadmill stress test 07/25/2021: Low risk study. See report for additional details  RADIOLOGY: CTA chest/abdomen/pelvis 09/02/2018: IMPRESSION: Acute Stanford type A thoracic aortic dissection starting from the aortic root which is aneurysmal in appearance up to 5.1 cm in caliber. Spiraling of the dissection flap is noted with involvement of dissection into the right brachiocephalic and left subclavian arteries.   Extension of dissection into the nonaneurysmal abdominal aorta, both common iliac and external iliac arteries as well as extension into the right internal iliac artery with occluded or near occluded appearance of the distal right common iliac artery and extension of the dissection into the left renal artery.   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   CTA chest with and without contrast at [facility]. Records available in Care Everywhere. 11/30/2021 1.  Repair of a type A aortic dissection, which is stable. No new dissection or increasing stenosis. There is a focal area of increased mural thrombus within the false lumen compared to 10/23/2019 CTA.  2.  Multiple tiny nodules in the left upper lobe  are nonspecific and may relate to an infectious or inflammatory process. Attention on follow-up imaging.  Risk Assessment/Calculations:   N/A   Labs:    February 22, 2021 [facility] available in Care Everywhere Cholesterol,  Total 100 - 199 mg/dL 870     Triglycerides 0 - 149 mg/dL 850     HDL >60 mg/dL 36 Low      VLDL Cholesterol Cal 5 - 40 mg/dL 26     LDL 0 - 99 mg/dL 67     LDL/HDL Ratio 0.0 - 3.6 ratio 1.9       04/27/2022 [facility] in Care Everywhere: Glucose 70 - 99 mg/dL 881 High    BUN 6 - 24 mg/dL 19   Creatinine 9.23 - 1.27 mg/dL 8.91   eGFR >40 fO/fpw/8.26 82   BUN/Creatinine Ratio 9 - 20 18   Sodium 134 - 144 mmol/L 139   Potassium 3.5 - 5.2 mmol/L 3.9   Chloride 96 - 106 mmol/L 100   CO2 20 - 29 mmol/L 22   CALCIUM  8.7 - 10.2 mg/dL 9.8   Total Protein 6.0 - 8.5 g/dL 7.4   Albumin, Serum 3.8 - 4.9 g/dL 4.4   Globulin, Total 1.5 - 4.5 g/dL 3.0   Albumin/Globulin Ratio 1.2 - 2.2 1.5   Total Bilirubin 0.0 - 1.2 mg/dL 0.5   Alkaline Phosphatase 44 - 121 IU/L 102   AST 0 - 40 IU/L 24   ALT (SGPT) 0 - 44 IU/L 28      Physical Exam:    Today's Vitals   11/19/23 0830 11/19/23 0902  BP: (!) 152/66 136/80  Pulse: 70   SpO2: 97%   Weight: 239 lb (108.4 kg)   Height: 6' 1 (1.854 m)    Body mass index is 31.53 kg/m. Wt Readings from Last 3 Encounters:  11/19/23 239 lb (108.4 kg)  03/07/23 235 lb 9.6 oz (106.9 kg)  08/21/22 238 lb 12.8 oz (108.3 kg)    Physical Exam  Constitutional: No distress.  hemodynamically stable  Neck: No JVD present.  Cardiovascular: Normal rate, regular rhythm, S1 normal and S2 normal. Exam reveals no gallop, no S3 and no S4.  No murmur heard. Pulmonary/Chest: Effort normal and breath sounds normal. No stridor. He has no wheezes. He has no rales.  Sternotomy site is well-healed.  Abdominal: Soft. Bowel sounds are normal. He exhibits no distension. There is no abdominal tenderness.  Musculoskeletal:        General: No edema.     Cervical back: Neck supple.  Neurological: He is alert and oriented to person, place, and time. He has intact cranial nerves (2-12).  Skin: Skin is warm.     Impression & Recommendation(s):  Impression:   ICD-10-CM   1.  Essential hypertension  I10 EKG 12-Lead    2. History of aortic dissection  Z86.79 EKG 12-Lead    3. Hx of repair of dissecting thoracic aortic aneurysm, Stanford type A  Z98.890 EKG 12-Lead   Z86.79     4. Mixed hyperlipidemia  E78.2 EKG 12-Lead    5. Class 1 obesity due to excess calories with serious comorbidity and body mass index (BMI) of 31.0 to 31.9 in adult  E66.811 EKG 12-Lead   E66.09    Z68.31        Recommendation(s):  Essential hypertension Office blood pressures are above goal (initially SBP 150 mmHg, on recheck SBP's 136 mmHg)- he has a component of whitecoat hypertension. Ambulatory blood pressure log  reviewed-summary noted above for reference. Continue current and hypertensive medications. Reemphasized importance of low-salt diet.  History of aortic dissection Hx of repair of dissecting thoracic aortic aneurysm, Stanford type A Follows with cardiothoracic surgery with serial imaging once every 2 years, last study in January 2023. Reemphasized the importance of blood pressure management. Reemphasized the importance of avoiding lifting heavy objects that would increase intra-abdominal pressures (i.e. Valsalva maneuver). If possible avoid antibiotics such as ciprofloxacin. Patient informed me that he does not have a CT of his chest scheduled as of right now for January 2025.  I have asked him to follow-up with his cardiothoracic surgeon to have this arranged as originally planned.  He will send us  a copy for reference when completed.  Mixed hyperlipidemia Currently on atorvastatin .   He denies myalgia or other side effects. No recent lipid profile available to review either in epic or Care Everywhere.   Advised him to follow-up with PCP to reevaluate his lipids.    Class 1 obesity due to excess calories with serious comorbidity and body mass index (BMI) of 31.0 to 31.9 in adult Body mass index is 31.53 kg/m. I reviewed with him importance of diet, regular physical  activity/exercise, weight loss.   Patient is educated on the importance of increasing physical activity gradually as tolerated with a goal of moderate intensity exercise for 30 minutes a day 5 days a week.  Orders Placed:  Orders Placed This Encounter  Procedures   EKG 12-Lead    Final Medication List:   No orders of the defined types were placed in this encounter.   There are no discontinued medications.   Current Outpatient Medications:    amLODipine  (NORVASC ) 5 MG tablet, TAKE 1 TABLET BY MOUTH EVERY EVENING, Disp: 180 tablet, Rfl: 1   aspirin  (ASPIRIN  LOW DOSE) 81 MG EC tablet, TAKE 1 TABLET(81 MG) BY MOUTH DAILY, Disp: 90 tablet, Rfl: 1   atorvastatin  (LIPITOR) 40 MG tablet, TAKE 1 TABLET(40 MG) BY MOUTH AT BEDTIME, Disp: 90 tablet, Rfl: 1   chlorthalidone  (HYGROTON ) 25 MG tablet, TAKE 1/2 TABLET(12.5 MG) BY MOUTH DAILY, Disp: 45 tablet, Rfl: 1   hydrALAZINE  (APRESOLINE ) 50 MG tablet, TAKE 1 TABLET(50 MG) BY MOUTH THREE TIMES DAILY., Disp: 270 tablet, Rfl: 0   losartan  (COZAAR ) 100 MG tablet, TAKE 1 TABLET(100 MG) BY MOUTH EVERY MORNING, Disp: 90 tablet, Rfl: 1   metoprolol  (TOPROL -XL) 200 MG 24 hr tablet, TAKE 1 TABLET(200 MG) BY MOUTH DAILY, Disp: 90 tablet, Rfl: 2  Consent:   N/A  Disposition:   1 year follow-up sooner if needed. Patient may be asked to follow-up sooner based on the results of the above-mentioned testing.  His questions and concerns were addressed to his satisfaction. He voices understanding of the recommendations provided during this encounter.    Signed, Madonna Large, DO, [facility]  [facility]  [address] [place], [place] [address] 11/19/2023 9:10 AM

## 2023-11-19 NOTE — Patient Instructions (Signed)
 Medication Instructions:  Your physician recommends that you continue on your current medications as directed. Please refer to the Current Medication list given to you today.  *If you need a refill on your cardiac medications before your next appointment, please call your pharmacy*  Lab Work: None ordered today. If you have labs (blood work) drawn today and your tests are completely normal, you will receive your results only by: MyChart Message (if you have MyChart) OR A paper copy in the mail If you have any lab test that is abnormal or we need to change your treatment, we will call you to review the results.  Testing/Procedures: None ordered today.  Follow-Up: At [facility], you and your health needs are our priority.  As part of our continuing mission to provide you with exceptional heart care, we have created designated Provider Care Teams.  These Care Teams include your primary Cardiologist (physician) and Advanced Practice Providers (APPs -  Physician Assistants and Nurse Practitioners) who all work together to provide you with the care you need, when you need it.  We recommend signing up for the patient portal called MyChart.  Sign up information is provided on this After Visit Summary.  MyChart is used to connect with patients for Virtual Visits (Telemedicine).  Patients are able to view lab/test results, encounter notes, upcoming appointments, etc.  Non-urgent messages can be sent to your provider as well.   To learn more about what you can do with MyChart, go to [email or web address].    Your next appointment:   January 2026  The format for your next appointment:   In Person  Provider:   Madonna Large, DO {

## 2023-12-21 ENCOUNTER — Other Ambulatory Visit: Payer: Self-pay | Admitting: Cardiology

## 2023-12-21 DIAGNOSIS — I10 Essential (primary) hypertension: Secondary | ICD-10-CM

## 2024-01-28 ENCOUNTER — Encounter: Payer: Self-pay | Admitting: Cardiology

## 2024-01-28 NOTE — Telephone Encounter (Signed)
 He follows up w/ CT surgery at [facility].   Thanks  Tessa Lerner, DO, [facility]

## 2024-03-21 ENCOUNTER — Other Ambulatory Visit: Payer: Self-pay | Admitting: Cardiology

## 2024-03-21 DIAGNOSIS — I10 Essential (primary) hypertension: Secondary | ICD-10-CM

## 2024-06-22 ENCOUNTER — Other Ambulatory Visit: Payer: Self-pay | Admitting: Cardiology

## 2024-06-22 DIAGNOSIS — E782 Mixed hyperlipidemia: Secondary | ICD-10-CM

## 2024-10-28 ENCOUNTER — Encounter: Payer: Self-pay | Admitting: Cardiology

## 2024-12-08 ENCOUNTER — Ambulatory Visit: Admitting: Cardiology

## 2024-12-08 ENCOUNTER — Encounter: Payer: Self-pay | Admitting: Cardiology

## 2024-12-08 VITALS — BP 158/78 | HR 66 | Resp 16 | Ht 73.0 in | Wt 254.6 lb

## 2024-12-08 DIAGNOSIS — Z9889 Other specified postprocedural states: Secondary | ICD-10-CM

## 2024-12-08 DIAGNOSIS — Z8679 Personal history of other diseases of the circulatory system: Secondary | ICD-10-CM | POA: Diagnosis not present

## 2024-12-08 DIAGNOSIS — E782 Mixed hyperlipidemia: Secondary | ICD-10-CM | POA: Diagnosis not present

## 2024-12-08 DIAGNOSIS — E66811 Obesity, class 1: Secondary | ICD-10-CM

## 2024-12-08 DIAGNOSIS — Z6831 Body mass index (BMI) 31.0-31.9, adult: Secondary | ICD-10-CM | POA: Diagnosis not present

## 2024-12-08 DIAGNOSIS — I10 Essential (primary) hypertension: Secondary | ICD-10-CM

## 2024-12-08 DIAGNOSIS — E6609 Other obesity due to excess calories: Secondary | ICD-10-CM

## 2024-12-08 MED ORDER — METOPROLOL SUCCINATE ER 200 MG PO TB24: 200.0000 mg | ORAL_TABLET | Freq: Every day | ORAL | Status: AC

## 2024-12-08 MED ORDER — LOSARTAN POTASSIUM 100 MG PO TABS: 100.0000 mg | ORAL_TABLET | Freq: Every day | ORAL | Status: AC

## 2024-12-08 NOTE — Progress Notes (Signed)
 " Cardiology Office Note:  .   ID:  Alexis Terry, DOB 1971-03-09, MRN [id number] PCP:  Samie Frederick, PA-C  Former Cardiology Providers: Emmalene Lawrence, APRN, FNP-C  Cardiothoracic Surgeon: Dr. Dallas Rana [facility] Providers Cardiologist:  Madonna Large, DO , [facility] (established care August 2021) Electrophysiologist:  None  Click to update primary MD,subspecialty MD or APP then REFRESH:1}    Chief Complaint  Patient presents with   Hypertension   Follow-up    History of Present Illness: .   Alexis Terry is a 54 y.o. Caucasian male whose past medical history and cardiovascular risk factors includes: History of type A aortic dissection status post replacement of the ascending aorta and hemiarch under [facility] with Dr. Rana at [facility], mixed hyperlipidemia, hypertension, obesity due to excess calories.   Patient being followed by the practice given his history of aortic dissection status postsurgical repair.  He follows up with cardiothoracic surgery at [facility] on a regular basis.  Given his history of the following with serial imaging of the chest once every 2 years, last study was in January 2023.  Since last office visit Alexis Terry denies any anginal chest pain or heart failure symptoms.   No hospitalizations or urgent care visits for cardiovascular reasons.   He has been compliant with his medical therapy and endorses no concerns.  Has gained weight since the last visit - working on addressing it.  Physical endurance remains stable - 10k steps/day.  Home SBP are well controlled.      Review of Systems: .   Review of Systems  Constitutional: Positive for weight gain.  Cardiovascular:  Negative for chest pain, dyspnea on exertion, leg swelling, near-syncope, palpitations, paroxysmal nocturnal dyspnea and syncope.  Respiratory:  Negative for shortness of breath.     Studies Reviewed:   EKG Interpretation Date/Time:  Tuesday December 08 2024 08:36:25 EST Ventricular  Rate:  65 PR Interval:  174 QRS Duration:  100 QT Interval:  444 QTC Calculation: 461 R Axis:   5  Text Interpretation: Normal sinus rhythm When compared with ECG of 19-Nov-2023 08:34, No significant change was found Confirmed by Large Madonna [phone]) on 12/13/2024 12:15:24 PM  Echocardiogram: 02/26/2023: LVEF 55 to 60%. Grade 1 diastolic dysfunction. Regurgitation: Mild AR, mild MR, mild TR, No pulmonary hypertension, RVSP 31 mmHg. Aortic root is mildly dilated at 4.2 cm. Pt. is s/p ascending aorta and hemiarch replacement.   Stress Testing: Exercise treadmill stress test 07/25/2021: Low risk study. See report for additional details  RADIOLOGY: CTA chest/abdomen/pelvis 09/02/2018: IMPRESSION: Acute Stanford type A thoracic aortic dissection starting from the aortic root which is aneurysmal in appearance up to 5.1 cm in caliber. Spiraling of the dissection flap is noted with involvement of dissection into the right brachiocephalic and left subclavian arteries.   Extension of dissection into the nonaneurysmal abdominal aorta, both common iliac and external iliac arteries as well as extension into the right internal iliac artery with occluded or near occluded appearance of the distal right common iliac artery and extension of the dissection into the left renal artery.   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   CTA chest with and without contrast at [facility]. Records available in Care Everywhere. 11/30/2021 1.  Repair of a type A aortic dissection, which is stable. No new dissection or increasing stenosis. There is a focal area of increased mural thrombus within the false lumen compared to 10/23/2019 CTA.  2.  Multiple tiny nodules in the left upper lobe are nonspecific and may relate to an infectious or inflammatory process. Attention on follow-up imaging.  Risk Assessment/Calculations:   N/A   Labs:         Physical Exam:    Today's Vitals   12/08/24 0834 12/08/24 0904  BP: (!) 148/76 (!) 158/78  Pulse: 66   Resp: 16   SpO2: 97%   Weight: 254 lb 9.6 oz (115.5 kg)   Height: 6' 1 (1.854 m)    Body mass index is 33.59 kg/m. Wt Readings from Last 3 Encounters:  12/08/24 254 lb 9.6 oz (115.5 kg)  11/19/23 239 lb (108.4 kg)  03/07/23 235 lb 9.6 oz (106.9 kg)    Physical Exam  Constitutional: No distress.  hemodynamically stable  Neck: No JVD present.  Cardiovascular: Normal rate, regular rhythm, S1 normal and S2 normal. Exam reveals no gallop, no S3 and no S4.  No murmur heard. Pulmonary/Chest: Effort normal and breath sounds normal. No stridor. He has no wheezes. He has no rales.  Sternotomy site is well-healed.  Abdominal: Soft. Bowel sounds are normal. He exhibits no distension. There is no abdominal tenderness.  Musculoskeletal:        General: No edema.     Cervical back: Neck supple.  Neurological: He is alert and oriented to person, place, and time. He has intact cranial nerves (2-12).  Skin: Skin is warm.     Impression & Recommendation(s):  Impression:   ICD-10-CM   1. Essential hypertension  I10 EKG 12-Lead    metoprolol  (TOPROL  XL) 200 MG 24 hr tablet    losartan  (COZAAR ) 100 MG tablet    2. History of aortic dissection  Z86.79 metoprolol  (TOPROL  XL) 200 MG 24 hr tablet    losartan  (COZAAR ) 100 MG tablet    3. Hx of repair of dissecting thoracic aortic aneurysm, Stanford type A  Z98.890 metoprolol  (TOPROL  XL) 200 MG 24 hr tablet   Z86.79 losartan  (COZAAR ) 100 MG tablet    4. Mixed hyperlipidemia  E78.2     5. Class 1 obesity due to excess calories with serious comorbidity and body mass index (BMI) of 31.0 to 31.9 in adult  E66.811    E66.09    Z68.31        Recommendation(s):  Essential hypertension Home blood pressures are very well-controlled, summarized averages recorded above. Office blood pressures are above goal. Continue Toprol -XL 200  milligrams p.o. every morning. Continue losartan  100 mg p.o. every morning. Continue chlorthalidone  12.5 mg p.o. every morning. Continue amlodipine  5 mg p.o. every afternoon Recommended goal SBP ~130 mmHg or less if able to tolerate given his history of Type A dissection  History of aortic dissection Hx of repair of dissecting thoracic aortic aneurysm, Stanford type A Follows with cardiothoracic surgery with serial imaging once every 2 years, last study in January 2023, as per care everywhere Encouraged him to follow-up with his cardiothoracic surgery at [facility] for his 2-year annual visit. Recommended obtaining imaging at the original institution for consistency. Reemphasized importance of blood pressure management. You should avoid use of antibiotics that fall into the class of floxacillin.  It is  best to avoid activities that cause grunting or straining (medically referred to as a valsalva maneuver). This happens when a person bears down against a closed throat to increase the strength of arm or abdominal muscles. Theres often a tendency to do this when lifting heavy weights, doing sit-ups, push-ups or chin-ups,  etc., but it may be harmful.   Mixed hyperlipidemia Outside labs reviewed from February 2025, LDL 80 mg/dL Continue atorvastatin  40 mg p.o. nightly  Class 1 obesity due to excess calories with serious comorbidity and body mass index (BMI) of 31.0 to 31.9 in adult Body mass index is 33.59 kg/m. I reviewed with him importance of diet, regular physical activity/exercise, weight loss.   Patient is educated on the importance of increasing physical activity gradually as tolerated with a goal of moderate intensity exercise for 30 minutes a day 5 days a week.   Orders Placed:  Orders Placed This Encounter  Procedures   EKG 12-Lead    Final Medication List:    Meds ordered this encounter  Medications   metoprolol  (TOPROL  XL) 200 MG 24 hr tablet    Sig: Take 1 tablet  (200 mg total) by mouth daily. In the morning   losartan  (COZAAR ) 100 MG tablet    Sig: Take 1 tablet (100 mg total) by mouth daily. At night    Medications Discontinued During This Encounter  Medication Reason   metoprolol  (TOPROL -XL) 200 MG 24 hr tablet Change in therapy   losartan  (COZAAR ) 100 MG tablet Change in therapy     Current Outpatient Medications:    amLODipine  (NORVASC ) 5 MG tablet, TAKE 1 TABLET BY MOUTH EVERY EVENING, Disp: 180 tablet, Rfl: 1   aspirin  (ASPIRIN  LOW DOSE) 81 MG EC tablet, TAKE 1 TABLET(81 MG) BY MOUTH DAILY, Disp: 90 tablet, Rfl: 1   atorvastatin  (LIPITOR) 40 MG tablet, TAKE 1 TABLET(40 MG) BY MOUTH AT BEDTIME, Disp: 90 tablet, Rfl: 0   chlorthalidone  (HYGROTON ) 25 MG tablet, TAKE 1/2 TABLET(12.5 MG) BY MOUTH DAILY, Disp: 45 tablet, Rfl: 3   hydrALAZINE  (APRESOLINE ) 50 MG tablet, TAKE 1 TABLET(50 MG) BY MOUTH THREE TIMES DAILY, Disp: 270 tablet, Rfl: 2   losartan  (COZAAR ) 100 MG tablet, Take 1 tablet (100 mg total) by mouth daily. At night, Disp: , Rfl:    metoprolol  (TOPROL  XL) 200 MG 24 hr tablet, Take 1 tablet (200 mg total) by mouth daily. In the morning, Disp: , Rfl:   Consent:   N/A  Disposition:   1 year follow-up sooner if needed. Patient may be asked to follow-up sooner based on the results of the above-mentioned testing.  His questions and concerns were addressed to his satisfaction. He voices understanding of the recommendations provided during this encounter.    Signed, Madonna Michele HAS, [facility]  A Division of [facility] [address], [place], [place] [address]   "

## 2024-12-08 NOTE — Patient Instructions (Signed)
 Medication Instructions:  Your physician recommends that you continue on your current medications as directed. Please refer to the Current Medication list given to you today.  *If you need a refill on your cardiac medications before your next appointment, please call your pharmacy*  Lab Work: None ordered If you have labs (blood work) drawn today and your tests are completely normal, you will receive your results only by: MyChart Message (if you have MyChart) OR A paper copy in the mail If you have any lab test that is abnormal or we need to change your treatment, we will call you to review the results.  Testing/Procedures: None ordered  Follow-Up: At [facility], you and your health needs are our priority.  As part of our continuing mission to provide you with exceptional heart care, our providers are all part of one team.  This team includes your primary Cardiologist (physician) and Advanced Practice Providers or APPs (Physician Assistants and Nurse Practitioners) who all work together to provide you with the care you need, when you need it.  Your next appointment:   1 year(s)  Provider:   Madonna Large, DO    We recommend signing up for the patient portal called MyChart.  Sign up information is provided on this After Visit Summary.  MyChart is used to connect with patients for Virtual Visits (Telemedicine).  Patients are able to view lab/test results, encounter notes, upcoming appointments, etc.  Non-urgent messages can be sent to your provider as well.   To learn more about what you can do with MyChart, go to [email or web address].   Other Instructions Please call and schedule your 2 year follow up with Dr. Burnie.

## 2024-12-10 ENCOUNTER — Other Ambulatory Visit: Payer: Self-pay | Admitting: Cardiology

## 2024-12-10 DIAGNOSIS — E782 Mixed hyperlipidemia: Secondary | ICD-10-CM

## 2024-12-13 ENCOUNTER — Encounter: Payer: Self-pay | Admitting: Cardiology
